# Patient Record
Sex: Female | Born: 1980 | Race: White | Hispanic: No | Marital: Married | Smoking: Never smoker
Health system: Southern US, Community
[De-identification: ages and names within clinical notes are randomized; demographics above are authoritative.]

## PROBLEM LIST (undated history)

## (undated) DIAGNOSIS — E039 Hypothyroidism, unspecified: Secondary | ICD-10-CM

## (undated) DIAGNOSIS — E282 Polycystic ovarian syndrome: Secondary | ICD-10-CM

## (undated) DIAGNOSIS — Z8619 Personal history of other infectious and parasitic diseases: Secondary | ICD-10-CM

## (undated) HISTORY — PX: WISDOM TOOTH EXTRACTION: SHX21

---

## 2006-08-30 ENCOUNTER — Ambulatory Visit: Payer: Self-pay | Admitting: Obstetrics and Gynecology

## 2006-08-30 ENCOUNTER — Encounter: Payer: Self-pay | Admitting: Obstetrics and Gynecology

## 2009-09-19 ENCOUNTER — Inpatient Hospital Stay (HOSPITAL_COMMUNITY): Admission: AD | Admit: 2009-09-19 | Discharge: 2009-09-19 | Payer: Self-pay | Admitting: Obstetrics & Gynecology

## 2009-09-19 ENCOUNTER — Ambulatory Visit: Payer: Self-pay | Admitting: Obstetrics & Gynecology

## 2009-09-21 ENCOUNTER — Ambulatory Visit: Payer: Self-pay | Admitting: Obstetrics and Gynecology

## 2009-09-21 ENCOUNTER — Inpatient Hospital Stay (HOSPITAL_COMMUNITY): Admission: AD | Admit: 2009-09-21 | Discharge: 2009-09-21 | Payer: Self-pay | Admitting: Obstetrics & Gynecology

## 2009-09-28 ENCOUNTER — Ambulatory Visit: Payer: Self-pay | Admitting: Obstetrics & Gynecology

## 2009-09-28 ENCOUNTER — Ambulatory Visit (HOSPITAL_COMMUNITY): Admission: RE | Admit: 2009-09-28 | Discharge: 2009-09-28 | Payer: Self-pay | Admitting: Obstetrics & Gynecology

## 2009-10-10 DEATH — deceased

## 2009-12-14 LAB — GC/CHLAMYDIA PROBE AMP, GENITAL
Chlamydia: NEGATIVE
Gonorrhea: NEGATIVE

## 2009-12-14 LAB — RPR: RPR: NONREACTIVE

## 2009-12-14 LAB — TYPE AND SCREEN: Antibody Screen: NEGATIVE

## 2010-03-25 LAB — CBC
Hemoglobin: 13.8 g/dL (ref 12.0–15.0)
Platelets: 252 10*3/uL (ref 150–400)
RBC: 4.53 MIL/uL (ref 3.87–5.11)
WBC: 10.8 10*3/uL — ABNORMAL HIGH (ref 4.0–10.5)

## 2010-03-25 LAB — GC/CHLAMYDIA PROBE AMP, GENITAL: GC Probe Amp, Genital: NEGATIVE

## 2010-03-25 LAB — HCG, QUANTITATIVE, PREGNANCY
hCG, Beta Chain, Quant, S: 1096 m[IU]/mL — ABNORMAL HIGH (ref <5)
hCG, Beta Chain, Quant, S: 157 m[IU]/mL — ABNORMAL HIGH (ref <5)

## 2010-03-25 LAB — POCT PREGNANCY, URINE: Preg Test, Ur: POSITIVE

## 2010-03-25 LAB — WET PREP, GENITAL
Clue Cells Wet Prep HPF POC: NONE SEEN
Trich, Wet Prep: NONE SEEN

## 2010-04-11 ENCOUNTER — Inpatient Hospital Stay (HOSPITAL_COMMUNITY): Admission: AD | Admit: 2010-04-11 | Payer: Self-pay | Source: Ambulatory Visit | Admitting: Obstetrics and Gynecology

## 2010-05-25 NOTE — Group Therapy Note (Signed)
NAMEMAHATI, VAJDA NO.:  [id number]   MEDICAL RECORD NO.:  [id number]          PATIENT TYPE:  WOC   LOCATION:  [facility]                   FACILITY:  [facility]   PHYSICIAN:  Argentina Donovan, MD        DATE OF BIRTH:  03-13-1980   DATE OF SERVICE:  08/30/2006                                  CLINIC NOTE   HISTORY OF PRESENT ILLNESS:  The patient is a 30 year old virginal  nulligravida, Caucasian female who is in for a premarital exam and  starting on birth control.  In addition to this, she is on several drugs  for treatment of acne and has a family history of hypothyroidism.  Other  than that, she is in very good health and has no other problems.  She is  5 feet, 6 inches, weighs 130 pounds.  Normal blood pressure 108/76.  We  have discussed the birth control pills with her and thought that the  generic Ovcon, because of the higher estrogen, might be beneficial to  her acne as well as her crampy dysmenorrhea that she has had since  menarche.   PHYSICAL EXAMINATION:  BREASTS:  Symmetrical with no dominant masses.  No nipple discharge.  No tenderness.  ABDOMEN:  Soft, nontender.  No masses or organomegaly.  External  Genitalia normal.  BUS within normal limits.  Vagina is clean and well  rugated.  The cervix is clean.  Nulliparous.  Uterus is anterior normal  size, consistency and the adnexa is normal.   PLAN:  TSH will be drawn.  The patient will placed on generic Ovcon.   DIAGNOSIS:  1. Premarital exam.  2. Dysmenorrhea.  3. Acne vulgaris.  4. Family history of hypothyroidism.           ______________________________  Argentina Donovan, MD     PR/MEDQ  D:  08/30/2006  T:  08/31/2006  Job:  [id number]

## 2010-08-08 ENCOUNTER — Encounter (HOSPITAL_COMMUNITY): Payer: Self-pay | Admitting: *Deleted

## 2010-08-08 ENCOUNTER — Inpatient Hospital Stay (HOSPITAL_COMMUNITY): Payer: BC Managed Care – PPO | Admitting: Anesthesiology

## 2010-08-08 ENCOUNTER — Other Ambulatory Visit: Payer: Self-pay | Admitting: Obstetrics and Gynecology

## 2010-08-08 ENCOUNTER — Encounter (HOSPITAL_COMMUNITY): Payer: Self-pay | Admitting: Anesthesiology

## 2010-08-08 ENCOUNTER — Encounter (HOSPITAL_COMMUNITY)
Admission: AD | Disposition: A | Discharge status: Home / Self Care | Payer: Self-pay | Source: Ambulatory Visit | Attending: Obstetrics and Gynecology

## 2010-08-08 ENCOUNTER — Inpatient Hospital Stay (HOSPITAL_COMMUNITY)
Admission: AD | Admit: 2010-08-08 | Discharge: 2010-08-11 | DRG: 371 | Disposition: A | Discharge status: Home / Self Care | Payer: BC Managed Care – PPO | Source: Ambulatory Visit | Attending: Obstetrics and Gynecology | Admitting: Obstetrics and Gynecology

## 2010-08-08 DIAGNOSIS — O324XX0 Maternal care for high head at term, not applicable or unspecified: Secondary | ICD-10-CM | POA: Diagnosis present

## 2010-08-08 DIAGNOSIS — E079 Disorder of thyroid, unspecified: Secondary | ICD-10-CM | POA: Diagnosis present

## 2010-08-08 DIAGNOSIS — E039 Hypothyroidism, unspecified: Secondary | ICD-10-CM | POA: Diagnosis present

## 2010-08-08 HISTORY — DX: Hypothyroidism, unspecified: E03.9

## 2010-08-08 LAB — CBC
HCT: 39.8 % (ref 36.0–46.0)
MCHC: 33.2 g/dL (ref 30.0–36.0)
RDW: 14.5 % (ref 11.5–15.5)

## 2010-08-08 LAB — STREP B DNA PROBE: GBS: NEGATIVE

## 2010-08-08 SURGERY — Surgical Case
Anesthesia: Epidural | Site: Abdomen | Wound class: Clean Contaminated

## 2010-08-08 MED ORDER — SODIUM CHLORIDE 0.9 % IR SOLN
Status: DC | PRN
  Administered 2010-08-08: 1000 mL

## 2010-08-08 MED ORDER — CITRIC ACID-SODIUM CITRATE 334-500 MG/5ML PO SOLN
30.0000 mL | ORAL | Status: DC | PRN
  Administered 2010-08-08: 30 mL via ORAL
  Filled 2010-08-08: qty 15

## 2010-08-08 MED ORDER — IBUPROFEN 600 MG PO TABS: 600.0000 mg | ORAL_TABLET | Freq: Four times a day (QID) | ORAL | Status: DC | PRN

## 2010-08-08 MED ORDER — OXYTOCIN 20 UNITS IN LACTATED RINGERS INFUSION - SIMPLE
125.0000 mL/h | INTRAVENOUS | Status: AC
  Administered 2010-08-09: 125 mL/h via INTRAVENOUS

## 2010-08-08 MED ORDER — MEPERIDINE HCL 25 MG/ML IJ SOLN
INTRAMUSCULAR | Status: AC
  Filled 2010-08-08: qty 1

## 2010-08-08 MED ORDER — TERBUTALINE SULFATE 1 MG/ML IJ SOLN: 0.2500 mg | Freq: Once | INTRAMUSCULAR | Status: AC | PRN

## 2010-08-08 MED ORDER — NALBUPHINE HCL 10 MG/ML IJ SOLN
5.0000 mg | INTRAMUSCULAR | Status: DC | PRN
Start: 2010-08-08 — End: 2010-08-11
  Filled 2010-08-08: qty 1

## 2010-08-08 MED ORDER — HYDROMORPHONE HCL 1 MG/ML IJ SOLN
INTRAMUSCULAR | Status: AC
  Filled 2010-08-08: qty 1

## 2010-08-08 MED ORDER — SODIUM CHLORIDE 0.9 % IV SOLN
1.0000 ug/kg/h | INTRAVENOUS | Status: DC | PRN
  Filled 2010-08-08: qty 2.5

## 2010-08-08 MED ORDER — LACTATED RINGERS IV SOLN
500.0000 mL | INTRAVENOUS | Status: DC | PRN
  Administered 2010-08-08 (×2): 500 mL via INTRAVENOUS

## 2010-08-08 MED ORDER — LACTATED RINGERS IV SOLN: 500.0000 mL | Freq: Once | INTRAVENOUS | Status: DC

## 2010-08-08 MED ORDER — FLEET ENEMA 7-19 GM/118ML RE ENEM: 1.0000 | ENEMA | RECTAL | Status: DC | PRN

## 2010-08-08 MED ORDER — NALOXONE HCL 0.4 MG/ML IJ SOLN: 0.4000 mg | INTRAMUSCULAR | Status: DC | PRN

## 2010-08-08 MED ORDER — MEPERIDINE HCL 25 MG/ML IJ SOLN
INTRAMUSCULAR | Status: DC | PRN
  Administered 2010-08-08: 25 mg via INTRAVENOUS

## 2010-08-08 MED ORDER — CEFAZOLIN SODIUM 1-5 GM-% IV SOLN
INTRAVENOUS | Status: DC | PRN
  Administered 2010-08-08: 1 g via INTRAVENOUS

## 2010-08-08 MED ORDER — PHENYLEPHRINE 40 MCG/ML (10ML) SYRINGE FOR IV PUSH (FOR BLOOD PRESSURE SUPPORT)
PREFILLED_SYRINGE | INTRAVENOUS | Status: AC
  Filled 2010-08-08: qty 10

## 2010-08-08 MED ORDER — MEDROXYPROGESTERONE ACETATE 150 MG/ML IM SUSP: 150.0000 mg | INTRAMUSCULAR | Status: DC | PRN

## 2010-08-08 MED ORDER — ONDANSETRON HCL 4 MG PO TABS: 4.0000 mg | ORAL_TABLET | ORAL | Status: DC | PRN

## 2010-08-08 MED ORDER — WITCH HAZEL-GLYCERIN EX PADS: MEDICATED_PAD | CUTANEOUS | Status: DC | PRN

## 2010-08-08 MED ORDER — ONDANSETRON HCL 4 MG/2ML IJ SOLN: 4.0000 mg | INTRAMUSCULAR | Status: DC | PRN

## 2010-08-08 MED ORDER — PHENYLEPHRINE 40 MCG/ML (10ML) SYRINGE FOR IV PUSH (FOR BLOOD PRESSURE SUPPORT)
80.0000 ug | PREFILLED_SYRINGE | INTRAVENOUS | Status: DC | PRN
  Filled 2010-08-08 (×2): qty 5

## 2010-08-08 MED ORDER — PHENYLEPHRINE HCL 10 MG/ML IJ SOLN
INTRAMUSCULAR | Status: DC | PRN
  Administered 2010-08-08: 40 ug via INTRAVENOUS
  Administered 2010-08-08: 80 ug via INTRAVENOUS
  Administered 2010-08-08: 120 ug via INTRAVENOUS

## 2010-08-08 MED ORDER — FENTANYL 2.5 MCG/ML BUPIVACAINE 1/10 % EPIDURAL INFUSION (WH - ANES)
14.0000 mL/h | INTRAMUSCULAR | Status: DC
  Administered 2010-08-08 (×2): 14 mL/h via EPIDURAL
  Filled 2010-08-08 (×2): qty 60

## 2010-08-08 MED ORDER — NALBUPHINE SYRINGE 5 MG/0.5 ML
5.0000 mg | INJECTION | INTRAMUSCULAR | Status: DC | PRN
  Filled 2010-08-08: qty 0.5

## 2010-08-08 MED ORDER — MEASLES, MUMPS & RUBELLA VAC ~~LOC~~ INJ
0.5000 mL | INJECTION | Freq: Once | SUBCUTANEOUS | Status: DC
  Filled 2010-08-08: qty 0.5

## 2010-08-08 MED ORDER — SODIUM CHLORIDE 0.9 % IJ SOLN: 3.0000 mL | Freq: Two times a day (BID) | INTRAMUSCULAR | Status: DC

## 2010-08-08 MED ORDER — KETOROLAC TROMETHAMINE 30 MG/ML IJ SOLN
30.0000 mg | Freq: Four times a day (QID) | INTRAMUSCULAR | Status: AC | PRN
  Administered 2010-08-09: 30 mg via INTRAVENOUS
  Filled 2010-08-08: qty 1

## 2010-08-08 MED ORDER — ONDANSETRON HCL 4 MG/2ML IJ SOLN
INTRAMUSCULAR | Status: AC
  Filled 2010-08-08: qty 2

## 2010-08-08 MED ORDER — OXYCODONE-ACETAMINOPHEN 5-325 MG PO TABS: 1.0000 | ORAL_TABLET | ORAL | Status: DC | PRN

## 2010-08-08 MED ORDER — SIMETHICONE 80 MG PO CHEW
80.0000 mg | CHEWABLE_TABLET | ORAL | Status: DC | PRN
  Administered 2010-08-09: 80 mg via ORAL

## 2010-08-08 MED ORDER — HYDROMORPHONE HCL 1 MG/ML IJ SOLN
INTRAMUSCULAR | Status: DC | PRN
  Administered 2010-08-08: 1 mg via INTRAVENOUS

## 2010-08-08 MED ORDER — ONDANSETRON HCL 4 MG/2ML IJ SOLN: 4.0000 mg | Freq: Three times a day (TID) | INTRAMUSCULAR | Status: DC | PRN

## 2010-08-08 MED ORDER — KETOROLAC TROMETHAMINE 60 MG/2ML IM SOLN
60.0000 mg | Freq: Once | INTRAMUSCULAR | Status: AC | PRN
  Administered 2010-08-08: 60 mg via INTRAMUSCULAR

## 2010-08-08 MED ORDER — SODIUM BICARBONATE 8.4 % IV SOLN
INTRAVENOUS | Status: AC
  Filled 2010-08-08: qty 50

## 2010-08-08 MED ORDER — MENTHOL 3 MG MT LOZG: 1.0000 | LOZENGE | OROMUCOSAL | Status: DC | PRN

## 2010-08-08 MED ORDER — SENNOSIDES-DOCUSATE SODIUM 8.6-50 MG PO TABS
1.0000 | ORAL_TABLET | Freq: Every day | ORAL | Status: DC
  Administered 2010-08-10: 2 via ORAL

## 2010-08-08 MED ORDER — OXYTOCIN 20 UNITS IN LACTATED RINGERS INFUSION - SIMPLE
125.0000 mL/h | Freq: Once | INTRAVENOUS | Status: DC
  Filled 2010-08-08 (×2): qty 1000

## 2010-08-08 MED ORDER — LACTATED RINGERS IV SOLN
INTRAVENOUS | Status: DC
  Administered 2010-08-08 (×3): via INTRAVENOUS

## 2010-08-08 MED ORDER — OXYTOCIN 20 UNITS IN LACTATED RINGERS INFUSION - SIMPLE
INTRAVENOUS | Status: DC | PRN
  Administered 2010-08-08: 20 [IU] via INTRAVENOUS

## 2010-08-08 MED ORDER — DIPHENHYDRAMINE HCL 50 MG/ML IJ SOLN: 12.5000 mg | INTRAMUSCULAR | Status: DC | PRN

## 2010-08-08 MED ORDER — LIDOCAINE-EPINEPHRINE (PF) 2 %-1:200000 IJ SOLN
INTRAMUSCULAR | Status: AC
  Filled 2010-08-08: qty 20

## 2010-08-08 MED ORDER — OXYCODONE-ACETAMINOPHEN 5-325 MG PO TABS: 2.0000 | ORAL_TABLET | ORAL | Status: DC | PRN

## 2010-08-08 MED ORDER — ONDANSETRON HCL 4 MG/2ML IJ SOLN: 4.0000 mg | Freq: Four times a day (QID) | INTRAMUSCULAR | Status: DC | PRN

## 2010-08-08 MED ORDER — EPHEDRINE 5 MG/ML INJ
10.0000 mg | INTRAVENOUS | Status: DC | PRN
  Filled 2010-08-08 (×2): qty 4

## 2010-08-08 MED ORDER — IBUPROFEN 600 MG PO TABS
600.0000 mg | ORAL_TABLET | Freq: Four times a day (QID) | ORAL | Status: DC
  Administered 2010-08-09 – 2010-08-11 (×9): 600 mg via ORAL
  Filled 2010-08-08 (×4): qty 1

## 2010-08-08 MED ORDER — KETOROLAC TROMETHAMINE 60 MG/2ML IM SOLN
INTRAMUSCULAR | Status: AC
  Administered 2010-08-08: 60 mg via INTRAMUSCULAR
  Filled 2010-08-08: qty 2

## 2010-08-08 MED ORDER — PHENYLEPHRINE 40 MCG/ML (10ML) SYRINGE FOR IV PUSH (FOR BLOOD PRESSURE SUPPORT)
80.0000 ug | PREFILLED_SYRINGE | INTRAVENOUS | Status: DC | PRN
  Filled 2010-08-08: qty 5

## 2010-08-08 MED ORDER — MORPHINE SULFATE (PF) 0.5 MG/ML IJ SOLN
INTRAMUSCULAR | Status: DC | PRN
  Administered 2010-08-08: 1 mg via INTRAVENOUS
  Administered 2010-08-08: 4 mg via EPIDURAL

## 2010-08-08 MED ORDER — DIPHENHYDRAMINE HCL 50 MG/ML IJ SOLN: 25.0000 mg | INTRAMUSCULAR | Status: DC | PRN

## 2010-08-08 MED ORDER — METOCLOPRAMIDE HCL 5 MG/ML IJ SOLN: 10.0000 mg | Freq: Three times a day (TID) | INTRAMUSCULAR | Status: DC | PRN

## 2010-08-08 MED ORDER — DEXTROSE IN LACTATED RINGERS 5 % IV SOLN
INTRAVENOUS | Status: DC
  Administered 2010-08-09: 08:00:00 via INTRAVENOUS

## 2010-08-08 MED ORDER — DIPHENHYDRAMINE HCL 25 MG PO CAPS: 25.0000 mg | ORAL_CAPSULE | ORAL | Status: DC | PRN

## 2010-08-08 MED ORDER — SCOPOLAMINE 1 MG/3DAYS TD PT72
MEDICATED_PATCH | TRANSDERMAL | Status: AC
  Administered 2010-08-08: 1.5 mg via TRANSDERMAL
  Filled 2010-08-08: qty 1

## 2010-08-08 MED ORDER — LIDOCAINE HCL (PF) 1 % IJ SOLN
30.0000 mL | INTRAMUSCULAR | Status: DC | PRN
  Filled 2010-08-08: qty 30

## 2010-08-08 MED ORDER — LIDOCAINE-EPINEPHRINE (PF) 2 %-1:200000 IJ SOLN
INTRAMUSCULAR | Status: DC | PRN
  Administered 2010-08-08: 20 mL via EPIDURAL

## 2010-08-08 MED ORDER — EPHEDRINE 5 MG/ML INJ
10.0000 mg | INTRAVENOUS | Status: DC | PRN
  Filled 2010-08-08: qty 4

## 2010-08-08 MED ORDER — SCOPOLAMINE 1 MG/3DAYS TD PT72
1.0000 | MEDICATED_PATCH | Freq: Once | TRANSDERMAL | Status: DC
  Administered 2010-08-08: 1.5 mg via TRANSDERMAL

## 2010-08-08 MED ORDER — DIPHENHYDRAMINE HCL 25 MG PO CAPS: 25.0000 mg | ORAL_CAPSULE | Freq: Four times a day (QID) | ORAL | Status: DC | PRN

## 2010-08-08 MED ORDER — MORPHINE SULFATE 0.5 MG/ML IJ SOLN
INTRAMUSCULAR | Status: AC
  Filled 2010-08-08: qty 10

## 2010-08-08 MED ORDER — MEPERIDINE HCL 25 MG/ML IJ SOLN: 6.2500 mg | INTRAMUSCULAR | Status: DC | PRN

## 2010-08-08 MED ORDER — ONDANSETRON HCL 4 MG/2ML IJ SOLN
INTRAMUSCULAR | Status: DC | PRN
  Administered 2010-08-08: 4 mg via INTRAVENOUS

## 2010-08-08 MED ORDER — PRENATAL PLUS 27-1 MG PO TABS
1.0000 | ORAL_TABLET | Freq: Every day | ORAL | Status: DC
  Administered 2010-08-09 – 2010-08-11 (×3): 1 via ORAL
  Filled 2010-08-08 (×3): qty 1

## 2010-08-08 MED ORDER — FENTANYL CITRATE 0.05 MG/ML IJ SOLN
INTRAMUSCULAR | Status: DC | PRN
  Administered 2010-08-08: 100 ug via INTRAVENOUS

## 2010-08-08 MED ORDER — TETANUS-DIPHTH-ACELL PERTUSSIS 5-2.5-18.5 LF-MCG/0.5 IM SUSP: 0.5000 mL | Freq: Once | INTRAMUSCULAR | Status: DC

## 2010-08-08 MED ORDER — ACETAMINOPHEN 325 MG PO TABS: 650.0000 mg | ORAL_TABLET | ORAL | Status: DC | PRN

## 2010-08-08 MED ORDER — KETOROLAC TROMETHAMINE 30 MG/ML IJ SOLN: 30.0000 mg | Freq: Four times a day (QID) | INTRAMUSCULAR | Status: AC | PRN

## 2010-08-08 MED ORDER — NALBUPHINE HCL 10 MG/ML IJ SOLN
5.0000 mg | INTRAMUSCULAR | Status: DC | PRN
  Filled 2010-08-08: qty 1

## 2010-08-08 MED ORDER — OXYTOCIN 10 UNIT/ML IJ SOLN
INTRAMUSCULAR | Status: AC
  Filled 2010-08-08: qty 4

## 2010-08-08 MED ORDER — CEFAZOLIN SODIUM 1-5 GM-% IV SOLN
1.0000 g | Freq: Once | INTRAVENOUS | Status: DC
  Filled 2010-08-08: qty 50

## 2010-08-08 MED ORDER — FENTANYL CITRATE 0.05 MG/ML IJ SOLN
INTRAMUSCULAR | Status: AC
  Filled 2010-08-08: qty 2

## 2010-08-08 MED ORDER — OXYTOCIN 20 UNITS IN LACTATED RINGERS INFUSION - SIMPLE
1.0000 m[IU]/min | INTRAVENOUS | Status: DC
  Administered 2010-08-08: 12 m[IU]/min via INTRAVENOUS
  Administered 2010-08-08: 2 m[IU]/min via INTRAVENOUS

## 2010-08-08 MED ORDER — SODIUM CHLORIDE 0.9 % IJ SOLN: 3.0000 mL | INTRAMUSCULAR | Status: DC | PRN

## 2010-08-08 MED ORDER — SIMETHICONE 80 MG PO CHEW
80.0000 mg | CHEWABLE_TABLET | Freq: Three times a day (TID) | ORAL | Status: DC
  Administered 2010-08-09 – 2010-08-11 (×11): 80 mg via ORAL

## 2010-08-08 MED ORDER — IBUPROFEN 600 MG PO TABS
600.0000 mg | ORAL_TABLET | Freq: Four times a day (QID) | ORAL | Status: DC | PRN
  Filled 2010-08-08 (×5): qty 1

## 2010-08-08 SURGICAL SUPPLY — 25 items
CHLORAPREP W/TINT 26ML (MISCELLANEOUS) ×2 IMPLANT
CLOTH BEACON ORANGE TIMEOUT ST (SAFETY) ×2 IMPLANT
DRAPE UTILITY XL STRL (DRAPES) ×4 IMPLANT
ELECT REM PT RETURN 9FT ADLT (ELECTROSURGICAL) ×2
ELECTRODE REM PT RTRN 9FT ADLT (ELECTROSURGICAL) ×1 IMPLANT
EXTRACTOR VACUUM M CUP 4 TUBE (SUCTIONS) IMPLANT
GLOVE BIO SURGEON STRL SZ 6.5 (GLOVE) ×2 IMPLANT
GLOVE BIOGEL PI IND STRL 7.0 (GLOVE) ×2 IMPLANT
GLOVE BIOGEL PI INDICATOR 7.0 (GLOVE) ×2
GOWN PREVENTION PLUS LG XLONG (DISPOSABLE) ×6 IMPLANT
KIT ABG SYR 3ML LUER SLIP (SYRINGE) ×2 IMPLANT
NEEDLE HYPO 25X5/8 SAFETYGLIDE (NEEDLE) ×2 IMPLANT
NS IRRIG 1000ML POUR BTL (IV SOLUTION) ×2 IMPLANT
PACK C SECTION WH (CUSTOM PROCEDURE TRAY) ×2 IMPLANT
SLEEVE SCD COMPRESS KNEE MED (MISCELLANEOUS) ×2 IMPLANT
STAPLER VISISTAT 35W (STAPLE) ×2 IMPLANT
SUT CHROMIC 0 CT 802H (SUTURE) IMPLANT
SUT CHROMIC 0 CTX 36 (SUTURE) ×4 IMPLANT
SUT MNCRL AB 3-0 PS2 27 (SUTURE) IMPLANT
SUT MON AB-0 CT1 36 (SUTURE) ×2 IMPLANT
SUT PDS AB 0 CTX 60 (SUTURE) ×2 IMPLANT
SUT PLAIN 0 NONE (SUTURE) IMPLANT
TOWEL OR 17X24 6PK STRL BLUE (TOWEL DISPOSABLE) ×4 IMPLANT
TRAY FOLEY CATH 14FR (SET/KITS/TRAYS/PACK) ×2 IMPLANT
WATER STERILE IRR 1000ML POUR (IV SOLUTION) ×2 IMPLANT

## 2010-08-08 NOTE — Progress Notes (Signed)
Pt uncomfortable w/ c/o back pain and ctx  FHT:  Reassuring toco Q2-3 Cvx 5/90/-2, no change  A/P:  Plan for epidural.  Recheck cvx 1hr

## 2010-08-08 NOTE — Progress Notes (Signed)
C/o pain w/ contractions. Declines meds or epidural  FHT reassuring, good fm Toco Q5-7min Cvx 5/90/-2, no change in 2hrs IUPC placed easily  A/p:  Exp mngt.

## 2010-08-08 NOTE — Progress Notes (Signed)
I've been contracting over 24hrs. Some bloody show. Some sharp pains in lower abd, esp when sitting up straight or sometimes with FM.

## 2010-08-08 NOTE — Progress Notes (Signed)
Dr Renaldo Fiddler notified of pt's admission and status. Aware of ctx pattern, sve, reactive FHR. Will admit to BS. Report called to [employer].

## 2010-08-08 NOTE — Anesthesia Preprocedure Evaluation (Addendum)
Anesthesia Evaluation  Name, MR# and DOB Patient awake  General Assessment Comment  Reviewed: Allergy & Precautions, H&P  and Patient's Chart, lab work & pertinent test results  Airway Mallampati: III TM Distance: >3 FB Neck ROM: full    Dental No notable dental hx    Pulmonaryneg pulmonary ROS    clear to auscultation  pulmonary exam normal   Cardiovascular regular Normal   Neuro/PsychNegative Neurological ROS Negative Psych ROS  GI/Hepatic/Renal negative GI ROS, negative Liver ROS, and negative Renal ROS (+)       Endo/Other  Negative Endocrine ROS (+)  Hypothyroidism,  Abdominal   Musculoskeletal  Hematology negative hematology ROS (+)   Peds  Reproductive/Obstetrics (+) Pregnancy   Anesthesia Other Findings             Anesthesia Physical Anesthesia Plan  ASA: III  Anesthesia Plan: Epidural   Post-op Pain Management:    Induction:   Airway Management Planned:   Additional Equipment:   Intra-op Plan:   Post-operative Plan:   Informed Consent: I have reviewed the patients History and Physical, chart, labs and discussed the procedure including the risks, benefits and alternatives for the proposed anesthesia with the patient or authorized representative who has indicated his/her understanding and acceptance.     Plan Discussed with:   Anesthesia Plan Comments:         Anesthesia Quick Evaluation

## 2010-08-08 NOTE — Progress Notes (Signed)
Pt feeling increased rectal pressure.  FHT: reassuring Toco: Q1-2 Cvx:  AL/0  A/P:  Exp mngt.

## 2010-08-08 NOTE — H&P (Signed)
Katherine Ochoa is a 30 y.o. female @ 34 weels presenting for labor.  No vb or lof.  Pregnancy uncomplicated.   History OB History    Grav Para Term Preterm Abortions TAB SAB Ect Mult Living   2 1 1  1  1    0     Past Medical History  Diagnosis Date  . Hypothyroidism    Past Surgical History  Procedure Date  . Wisdom tooth extraction    Family History: family history includes Diabetes in her father; Heart disease in her father; and Stroke in her father. Social History:  reports that she has never smoked. She has never used smokeless tobacco. She reports that she does not drink alcohol or use illicit drugs.  ROS neg  Dilation: 5 Effacement (%): 90 Station: -2 Exam by:: Quintella Baton RNC Blood pressure 118/64, pulse 72, temperature 98.1 F (36.7 C), temperature source Oral, resp. rate 20, height 5\' 5"  (1.651 m), weight 105.688 kg (233 lb), unknown if currently breastfeeding. Exam Physical Exam Gen: uncomfortable w/ contractions Abd - gravid, NT Ext NT Cvx 5cm per RN exam  Prenatal labs: ABO, Rh: O POS (09/10 0355) Antibody: Negative (12/05 0000) Rubella:   RPR: Nonreactive (12/05 0000)  HBsAg: Negative (12/05 0000)  HIV: Non-reactive (12/05 0000)  GBS: Negative (07/29 0000)   Assessment/Plan: Admit Exp mngt Pt requests intermittent monitoring   Jahdai Padovano 08/08/2010, 8:02 AM

## 2010-08-08 NOTE — Transfer of Care (Signed)
Immediate Anesthesia Transfer of Care Note  Patient: Katherine Ochoa  Procedure(s) Performed:  CESAREAN SECTION - primary of baby boy at 2105  Patient Location: PACU  Anesthesia Type: Epidural  Level of Consciousness: awake, alert  and oriented  Airway & Oxygen Therapy: Patient Spontanous Breathing  Post-op Assessment: Report given to PACU RN, Post -op Vital signs reviewed and stable and Patient moving all extremities  Post vital signs: stable  Complications: No apparent anesthesia complications

## 2010-08-08 NOTE — Progress Notes (Signed)
Pt getting more comfortable w/ epidural.  C/o pain with foley catheter  FHT reassuring Toco Q2-3 Cvx 5-6/C/-2  A/P:  No cervical change.  Plan to recheck in one hour since epidual delayed.   Pt and husband agree with plan

## 2010-08-08 NOTE — Progress Notes (Signed)
Pt c/o increased pain with foley catheter and IUPC.  Pt requests removal of both.  C/o pressure w/ contractions  FHT:  Reassuring w/ accels Toco:  Q1-2 Cvx: C/C/0  Will start pushing.   Foley cath and IUPC removed

## 2010-08-08 NOTE — Op Note (Signed)
Cesarean Section Procedure Note  Indications: failure to progress: arrest of descent  Pre-operative Diagnosis: 41 week 1 day pregnancy.  Post-operative Diagnosis: same  Surgeon: Airika Alkhatib   Assistants: none  Anesthesia: Epidural anesthesia  Procedure Details  The risks, benefits, complications, treatment options, and expected outcomes were discussed with the patient.  The patient concurred with the proposed plan, giving informed consent.  The patient was taken to Operating Room # 1, identified as Katherine Ochoa and the procedure verified as C-Section Delivery. A Time Out was held and the above information confirmed.  After induction of anesthesia, the patient was draped and prepped in the usual sterile manner. A Pfannenstiel incision was made and carried down through the subcutaneous tissue to the fascia. Fascial incision was made and extended transversely. The fascia was separated from the underlying rectus tissue superiorly and inferiorly. The peritoneum was identified and entered. Peritoneal incision was extended longitudinally. The utero-vesical peritoneal reflection was incised transversely and the bladder flap was bluntly freed from the lower uterine segment. A low transverse uterine incision was made. Delivered from OP vertex presentation was a viable female infant with Apgar scores of 8 at one minute and 9 at five minutes. After the umbilical cord was clamped and cut cord blood was obtained for evaluation. The placenta was removed intact and appeared normal. The uterine outline, tubes and ovaries appeared normal. The uterine incision was closed with running locked sutures of 0 chromic. Hemostasis was observed. Lavage was carried out until clear. Peritoneum closed with 0 monocryl.  The fascia was then reapproximated with running sutures of 0 PDS. The skin was reapproximated with staples.  Instrument, sponge, and needle counts were correct prior the abdominal closure and at the conclusion  of the case.   Findings: Female infant 8#8oz, normal appearing pelvic anatomy.  Cord pH 7.27  Estimated Blood Loss:  700cc         Total IV Fluids:         Specimens: placenta to path         UOP:  100cc, blood tinged but clear in tube         Complications:  None; patient tolerated the procedure well.         Disposition: PACU - hemodynamically stable.         Condition: stable  Attending Attestation: I was present and scrubbed for the entire procedure.

## 2010-08-08 NOTE — Anesthesia Procedure Notes (Signed)
Epidural Patient location during procedure: OB Start time: 08/08/2010 2:57 PM  Staffing Anesthesiologist: Brayton Caves R Performed by: anesthesiologist   Preanesthetic Checklist Completed: patient identified, site marked, surgical consent, pre-op evaluation, timeout performed, IV checked, risks and benefits discussed and monitors and equipment checked  Epidural Patient position: sitting Prep: site prepped and draped and DuraPrep Patient monitoring: continuous pulse ox and blood pressure Approach: midline Injection technique: LOR saline  Needle:  Needle type: Tuohy  Needle gauge: 17 G Needle length: 9 cm Needle insertion depth: 5 cm cm Catheter type: closed end flexible Catheter size: 19 Gauge Catheter at skin depth: 10 cm Test dose: negative  Assessment Events: blood not aspirated, injection not painful, no injection resistance, negative IV test and no paresthesia  Additional Notes Patient identified.  Risk benefits discussed including failed block, incomplete pain control, headache, nerve damage, paralysis, blood pressure changes, nausea, vomiting, reactions to medication both toxic or allergic, and postpartum back pain.  Patient expressed understanding and wished to proceed.  All questions were answered.  Sterile technique used throughout procedure and epidural site dressed with sterile barrier dressing. No paresthesia or other complications noted. 5 minutes prior to starting epidural infusion of fentanyl and bupivicaine, the epidural was loaded with 9 ml of 1.5% lidocaine in three separate doses.  The patient did not experience any signs of intravascular injection such as tinnitus or metallic taste in mouth nor signs of intrathecal spread such as rapid motor block. Please see nursing notes for vital signs.

## 2010-08-08 NOTE — Progress Notes (Signed)
Pt pushing  FHT reassuring w/ small variables during pushes Toco Q2 Cvx C/C/0  A/P:  Rec primary c-section.  Pt and husband agree to proceed

## 2010-08-08 NOTE — Progress Notes (Signed)
0530 To BS via w/c.

## 2010-08-08 NOTE — Plan of Care (Signed)
Problem: Consults Goal: Birthing Suites Patient Information Press F2 to bring up selections list Outcome: Completed/Met Date Met:  08/08/10  Pt > [redacted] weeks EGA

## 2010-08-08 NOTE — Progress Notes (Signed)
Pt pushing  AF, VSS FHT - reassuring Toco - Q2 Cvx - C/C/0  Will continue pushing, recheck in 20-30min.  If no descent, plan for c-section

## 2010-08-08 NOTE — Progress Notes (Signed)
Pt uncomfortable with contractions, declines pain mngt  FHT reassuring Toco Q3-5, IUPC fell out w/ ambulation Cvx 5/90/-2 IUPC easily replaced  A/P:  Offered pt primary c-section vs pitocin augmentation.  Pt elects for pitocin. Will recheck in 2 hours

## 2010-08-08 NOTE — Anesthesia Postprocedure Evaluation (Signed)
  Anesthesia Post-op Note  Patient: Katherine Ochoa  Procedure(s) Performed:  CESAREAN SECTION - primary of baby boy at 2105  No anesthesia complications.  Level of consciousness: alert. Cardiopulmonary status stable.  No follow-up care or observation required.  Cam Harnden L. Rodman Pickle, MD

## 2010-08-08 NOTE — Progress Notes (Signed)
Pt with rectal pressure and pain.  FHT:  Reassuring with occasional small variables Toco:  Q2-3 Cvx 7/C/-2  A/p:  Some change with dilation but still high in pelvis.  Will continue with exp mngt

## 2010-08-09 ENCOUNTER — Inpatient Hospital Stay (HOSPITAL_COMMUNITY): Admission: RE | Admit: 2010-08-09 | Payer: Self-pay | Source: Ambulatory Visit

## 2010-08-09 ENCOUNTER — Encounter (HOSPITAL_COMMUNITY): Payer: Self-pay | Admitting: *Deleted

## 2010-08-09 LAB — CBC
HCT: 35.7 % — ABNORMAL LOW (ref 36.0–46.0)
MCV: 92.5 fL (ref 78.0–100.0)
Platelets: 156 10*3/uL (ref 150–400)
RBC: 3.86 MIL/uL — ABNORMAL LOW (ref 3.87–5.11)
WBC: 16.2 10*3/uL — ABNORMAL HIGH (ref 4.0–10.5)

## 2010-08-09 NOTE — Anesthesia Postprocedure Evaluation (Signed)
  Anesthesia Post-op Note  Patient: Katherine Ochoa  Procedure(s) Performed:  CESAREAN SECTION - primary of baby boy at 2105  Patient Location147  Anesthesia Type: Regional  Level of Consciousness: awake  Airway and Oxygen Therapy: Patient Spontanous Breathing    Post-op Assessment: Post-op Vital signs reviewed  Post-op Vital Signs: Reviewed  Complications: No apparent anesthesia complications

## 2010-08-09 NOTE — Addendum Note (Signed)
Addendum  created 08/09/10 1127 by Randa Spike   Modules edited:Charges VN, Notes Section

## 2010-08-09 NOTE — Progress Notes (Signed)
Baby rooting and latched well with assistance. Infant 12 hours old.

## 2010-08-09 NOTE — Addendum Note (Signed)
Addendum  created 08/09/10 1127 by Chole Driver D Lateasha Breuer   Modules edited:Charges VN, Notes Section    

## 2010-08-09 NOTE — Progress Notes (Signed)
Subjective: Postpartum Day 1: Cesarean Delivery Patient reports tolerating PO.    Objective: Vital signs in last 24 hours: Temp:  [97.7 F (36.5 C)-99.7 F (37.6 C)] 98.4 F (36.9 C) (07/30 0710) Pulse Rate:  [66-131] 81  (07/30 0710) Resp:  [18-24] 20  (07/30 0710) BP: (75-135)/(35-97) 92/56 mmHg (07/30 0710) SpO2:  [94 %-100 %] 95 % (07/30 0504) Weight:  [102.513 kg (226 lb)] 226 lb (102.513 kg) (07/30 0509)  Physical Exam:  General: alert and cooperative Lochia: appropriate Uterine Fundus: firm Abd dressing CDI Foley with adequate op DVT Evaluation: No evidence of DVT seen on physical exam.   Basename 08/09/10 0540 08/08/10 0600  HGB 11.6* 13.2  HCT 35.7* 39.8    Assessment/Plan: Status post Cesarean section. Doing well postoperatively.  Continue current care.  CURTIS,CAROL G 08/09/2010, 8:21 AM

## 2010-08-10 NOTE — Progress Notes (Signed)
Subjective: Postpartum Day 2: Cesarean Delivery Patient reports tolerating PO and + flatus.    Objective: Vital signs in last 24 hours: Temp:  [97.7 F (36.5 C)-98.4 F (36.9 C)] 97.8 F (36.6 C) (07/31 0623) Pulse Rate:  [67-98] 67  (07/31 0623) Resp:  [18-20] 18  (07/31 0623) BP: (96-117)/(62-71) 96/64 mmHg (07/31 0623) SpO2:  [95 %-98 %] 98 % (07/30 2100)  Physical Exam:  General: alert and cooperative Lochia: appropriate Uterine Fundus: firm Incision: healing well DVT Evaluation: No evidence of DVT seen on physical exam.   Basename 08/09/10 0540 08/08/10 0600  HGB 11.6* 13.2  HCT 35.7* 39.8    Assessment/Plan: Status post Cesarean section. Doing well postoperatively.  Continue current care.  CURTIS,CAROL G 08/10/2010, 8:48 AM

## 2010-08-11 MED ORDER — IBUPROFEN 600 MG PO TABS: 600.0000 mg | ORAL_TABLET | Freq: Four times a day (QID) | ORAL | Status: AC | PRN

## 2010-08-11 MED ORDER — OXYCODONE-ACETAMINOPHEN 5-325 MG PO TABS: 1.0000 | ORAL_TABLET | ORAL | Status: AC | PRN

## 2010-08-11 NOTE — Discharge Summary (Signed)
Obstetric Discharge Summary Reason for Admission: onset of labor Prenatal Procedures: none Intrapartum Procedures: cesarean: low cervical, transverse Postpartum Procedures: none Complications-Operative and Postpartum: none Hemoglobin  Date Value Range Status  08/09/2010 11.6* 12.0-15.0 (Ochoa/dL) Final     HCT  Date Value Range Status  08/09/2010 35.7* 36.0-46.0 (%) Final    Discharge Diagnoses: Term Pregnancy-delivered  Discharge Information: Date: 08/11/2010 Activity: pelvic rest Diet: routine Medications: PNV, Ibuprophen, Percocet and synthroid Condition: stable Instructions: refer to practice specific booklet Discharge to: home   Newborn Data: Live born female  Birth Weight: 8 lb 8 oz (3856 Ochoa) APGAR: 8, 9  Home with mother.  Katherine Ochoa 08/11/2010, 8:57 AM

## 2010-08-11 NOTE — Progress Notes (Cosign Needed)
Subjective: Postpartum Day 3: Cesarean Delivery Patient reports tolerating PO, + flatus and no problems voiding.    Objective: Vital signs in last 24 hours: Temp:  [97.7 F (36.5 C)-98.1 F (36.7 C)] 98.1 F (36.7 C) (08/01 0652) Pulse Rate:  [62-82] 62  (08/01 0652) Resp:  [18] 18  (08/01 0652) BP: (103-111)/(66-67) 111/67 mmHg (08/01 1610)  Physical Exam:  General: alert and cooperative Lochia: appropriate Uterine Fundus: firm Incision: healing well.Lower abd noted to be slightly erythematous DVT Evaluation: No evidence of DVT seen on physical exam.   Basename 08/09/10 0540  HGB 11.6*  HCT 35.7*    Assessment/Plan: Status post Cesarean section. Doing well postoperatively.  Discharge home with standard precautions and return to clinic in 1-2 days for staple removal and reevaluation of erythema.Patient declined ATB's   Zohan Shiflet G 08/11/2010, 8:51 AM

## 2010-08-11 NOTE — Progress Notes (Signed)
Mother states infant just finished 30 min feeding. Encouraged mother to cue feed to increase milk supply and to increase infants appetite. Parents very receptive to teaching. Encouraged to call lc office if any questions.

## 2010-09-22 NOTE — Addendum Note (Signed)
Addendum  created 09/22/10 1100 by Randa Spike   Modules edited:Notes Section

## 2010-09-22 NOTE — Transfer of Care (Incomplete)
Immediate Anesthesia Transfer of Care Note  Patient: Katherine Ochoa  Procedure(s) Performed:  CESAREAN SECTION - primary of baby boy at 2105  Patient Location: {PLACES; ANE POST:19477::"PACU"}  Anesthesia Type: {PROCEDURES; ANE POST ANESTHESIA TYPE:19480}  Level of Consciousness: {FINDINGS; ANE POST LEVEL OF CONSCIOUSNESS:19484}  Airway & Oxygen Therapy: {Exam; oxygen device:30095}  Post-op Assessment: {ASSESSMENT;POST-OP ZOXWRU:04540}  Post vital signs: {DESC; ANE POST JWJXBJ:47829}  Complications: {FINDINGS; ANE POST COMPLICATIONS:19485}

## 2010-09-22 NOTE — Addendum Note (Signed)
Addendum  created 09/22/10 1121 by Randa Spike   Modules edited:Anesthesia Events

## 2010-10-05 ENCOUNTER — Encounter (HOSPITAL_COMMUNITY): Payer: Self-pay | Admitting: Obstetrics and Gynecology

## 2011-05-23 IMAGING — US US OB COMP LESS 14 WK
1 series · 14 of 28 positions shown · non-contrast
Comparison: None

CLINICAL DATA: 28-year-old pregnant female with estimated gestation
of 6 weeks by LMP.  Vaginal bleeding and pelvic pain.

OBSTETRIC <14 WK US AND TRANSVAGINAL OB US
TECHNIQUE: Both transabdominal and transvaginal ultrasound
examinations were performed for complete evaluation of the
gestation as well as the maternal uterus, adnexal regions, and
pelvic cul-de-sac.  Transvaginal technique was performed to assess
early pregnancy.

[Series 1: us ob comp less 14 wks · 0.25mm/px · 14 of 36 slices shown]
[im 2/36]
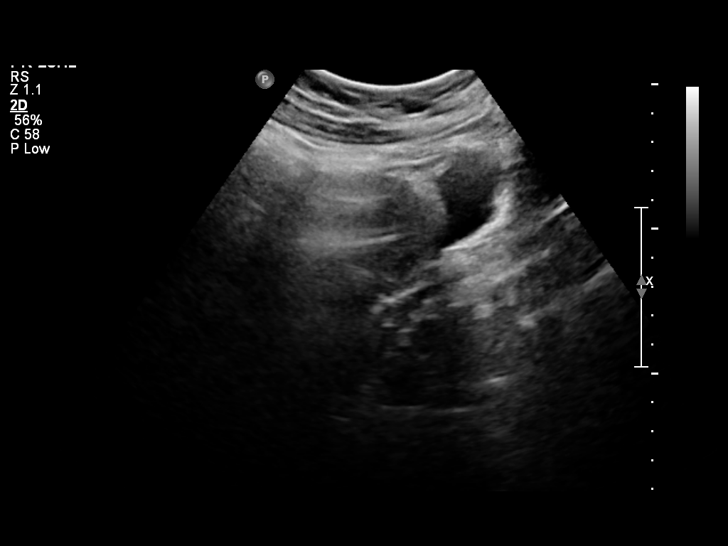
[im 4/36]
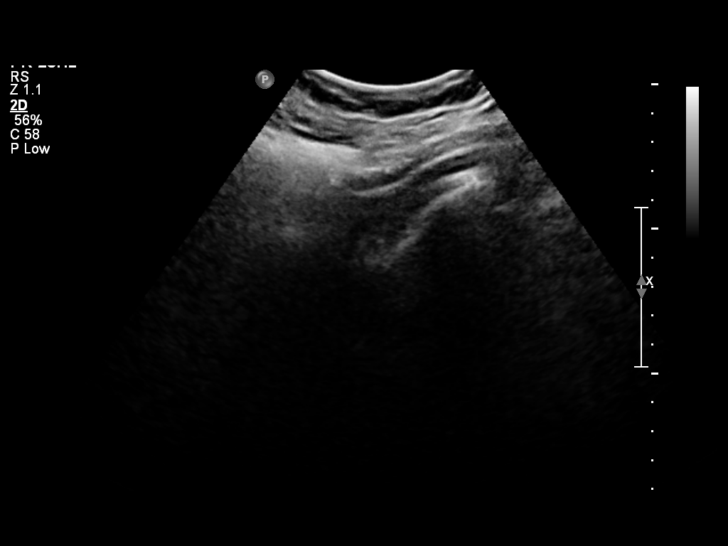
[im 7/36]
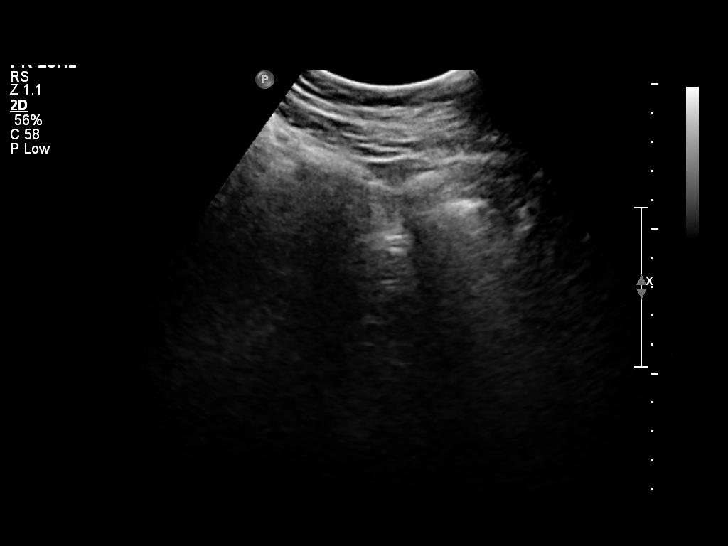
[im 10/36]
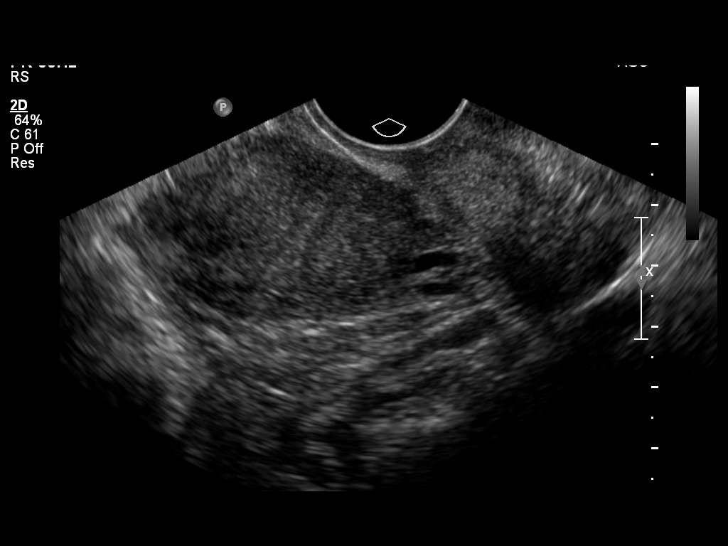
[im 12/36]
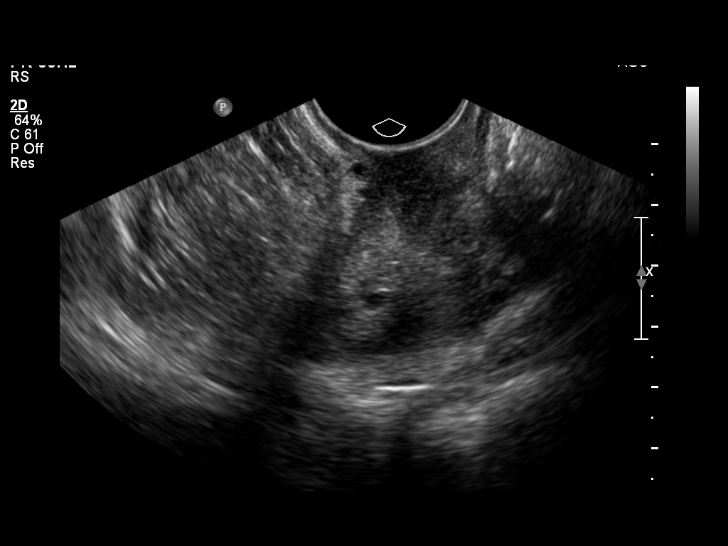
[im 15/36]
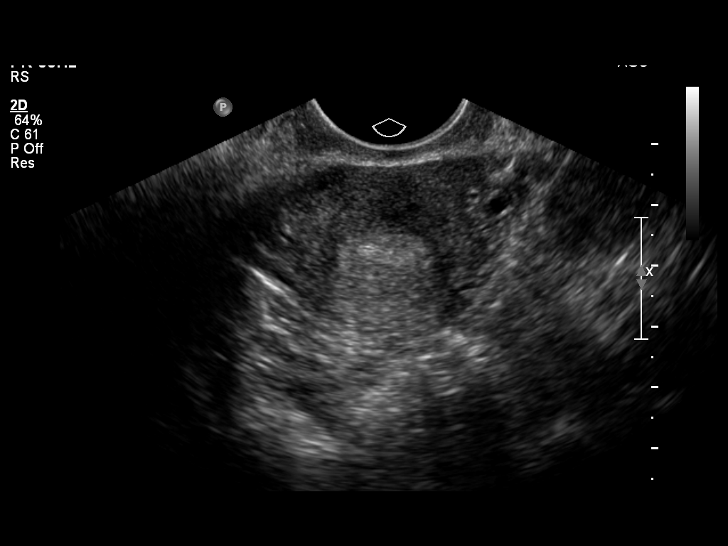
[im 17/36]
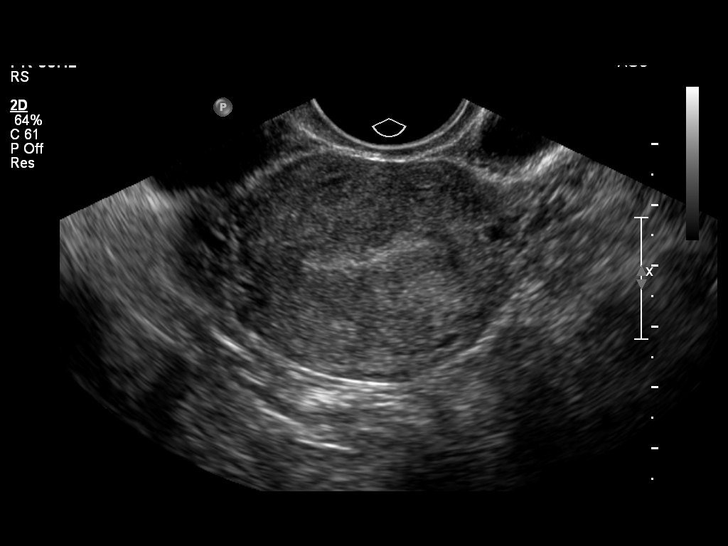
[im 20/36]
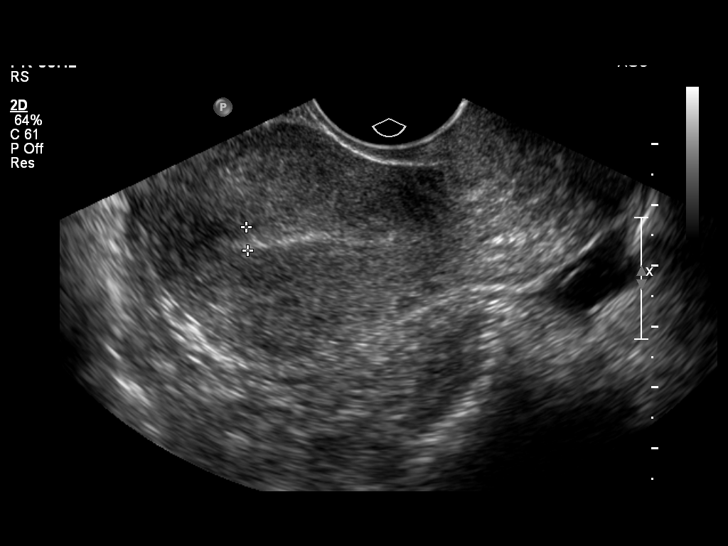
[im 23/36]
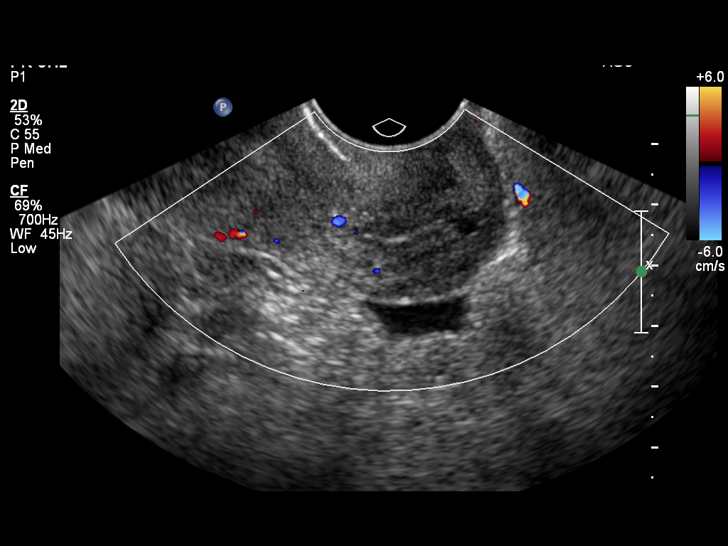
[im 25/36]
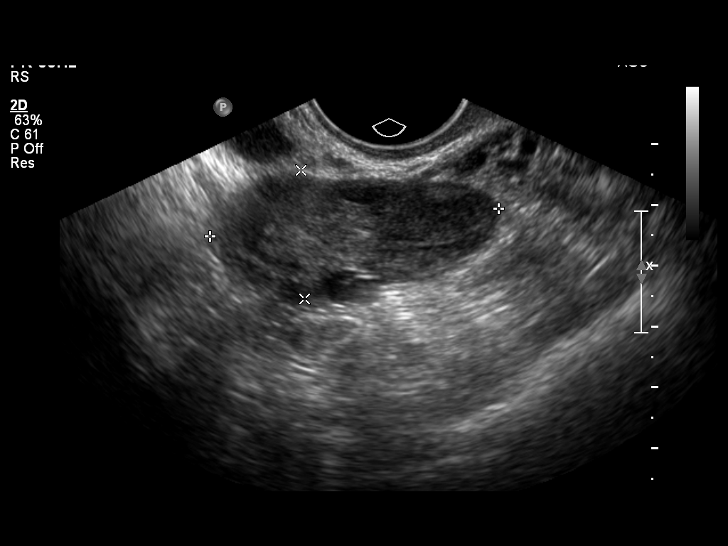
[im 28/36]
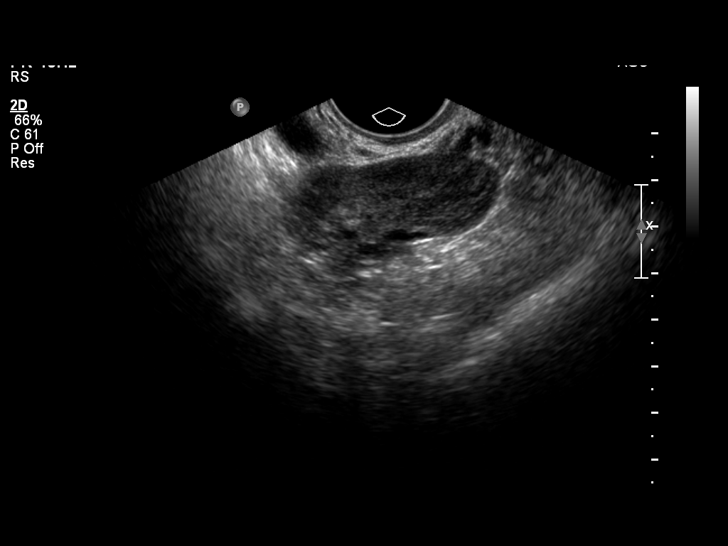
[im 30/36]
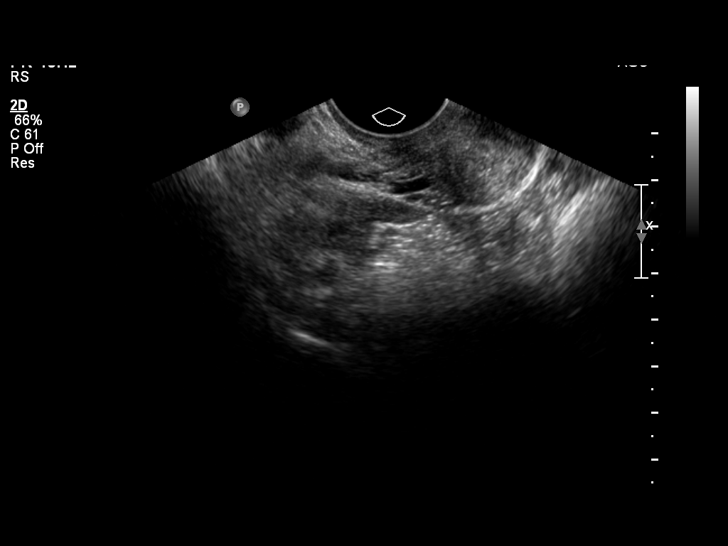
[im 33/36]
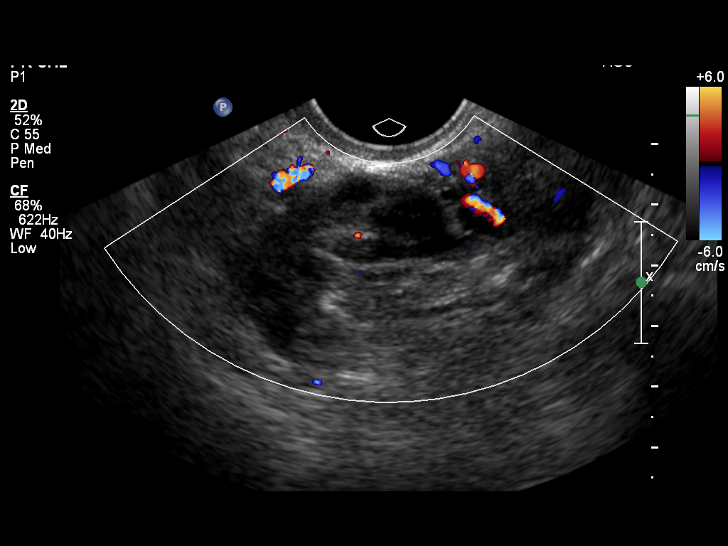
[im 36/36]
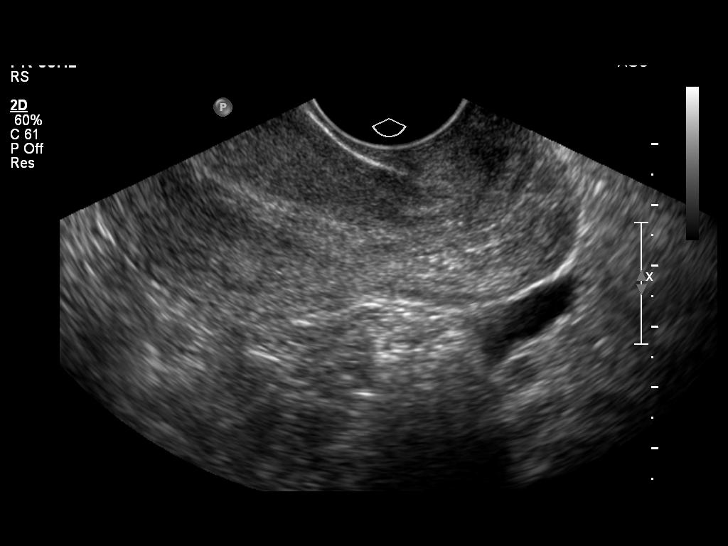

[14 of 28 positions shown; findings below may reference images not displayed]

FINDINGS: The uterus is unremarkable and there is no evidence of intrauterine
pregnancy.
The ovaries bilaterally are normal in appearance and size.
A trace amount of free fluid within the pelvis is noted.
There is no evidence of adnexal mass.
IMPRESSION: No evidence of intrauterine pregnancy.  In light of this patient's
positive pregnancy test, this may represent an normal early
pregnancy or failed first trimester pregnancy. Ectopic pregnancy is
not entirely excluded although there is no evidence of adnexal
mass.  Follow-up is recommended.

Trace amount of free pelvic fluid - nonspecific but may be
physiologic.

## 2012-07-09 LAB — OB RESULTS CONSOLE HIV ANTIBODY (ROUTINE TESTING): HIV: NONREACTIVE

## 2012-07-09 LAB — OB RESULTS CONSOLE RPR: RPR: NONREACTIVE

## 2012-08-02 ENCOUNTER — Other Ambulatory Visit: Payer: Self-pay | Admitting: Obstetrics and Gynecology

## 2012-08-29 ENCOUNTER — Emergency Department (HOSPITAL_COMMUNITY)
Admission: EM | Admit: 2012-08-29 | Discharge: 2012-08-30 | Disposition: A | Discharge status: Home / Self Care | Payer: BC Managed Care – PPO | Attending: Emergency Medicine | Admitting: Emergency Medicine

## 2012-08-29 DIAGNOSIS — T148XXA Other injury of unspecified body region, initial encounter: Secondary | ICD-10-CM

## 2012-08-29 DIAGNOSIS — Y9289 Other specified places as the place of occurrence of the external cause: Secondary | ICD-10-CM | POA: Insufficient documentation

## 2012-08-29 DIAGNOSIS — Z862 Personal history of diseases of the blood and blood-forming organs and certain disorders involving the immune mechanism: Secondary | ICD-10-CM | POA: Insufficient documentation

## 2012-08-29 DIAGNOSIS — S41009A Unspecified open wound of unspecified shoulder, initial encounter: Secondary | ICD-10-CM | POA: Insufficient documentation

## 2012-08-29 DIAGNOSIS — Z23 Encounter for immunization: Secondary | ICD-10-CM | POA: Insufficient documentation

## 2012-08-29 DIAGNOSIS — Z8639 Personal history of other endocrine, nutritional and metabolic disease: Secondary | ICD-10-CM | POA: Insufficient documentation

## 2012-08-29 DIAGNOSIS — Y9301 Activity, walking, marching and hiking: Secondary | ICD-10-CM | POA: Insufficient documentation

## 2012-08-29 DIAGNOSIS — IMO0001 Reserved for inherently not codable concepts without codable children: Secondary | ICD-10-CM | POA: Insufficient documentation

## 2012-08-29 DIAGNOSIS — O9989 Other specified diseases and conditions complicating pregnancy, childbirth and the puerperium: Secondary | ICD-10-CM | POA: Insufficient documentation

## 2012-08-29 MED ORDER — RABIES IMMUNE GLOBULIN 150 UNIT/ML IM INJ
20.0000 [IU]/kg | INJECTION | Freq: Once | INTRAMUSCULAR | Status: AC
  Administered 2012-08-29: 1800 [IU] via INTRAMUSCULAR
  Filled 2012-08-29: qty 12

## 2012-08-29 MED ORDER — RABIES VACCINE, PCEC IM SUSR
1.0000 mL | Freq: Once | INTRAMUSCULAR | Status: AC
  Administered 2012-08-29: 1 mL via INTRAMUSCULAR
  Filled 2012-08-29: qty 1

## 2012-08-29 NOTE — ED Provider Notes (Signed)
CSN: [id number]     Arrival date & time 08/29/12  2252 History  This chart was scribed for non-physician practitioner Kyung Bacca, PA-C, working with Sunnie Nielsen, MD, by Yevette Edwards, ED Scribe. This patient was seen in room WTR7/WTR7 and the patient's care was started at 11:03 PM   First MD Initiated Contact with Patient 08/29/12 2258     No chief complaint on file.   The history is provided by the patient. No language interpreter was used.   HPI Comments:  Katherine Ochoa is a 32 y.o. female, who is [redacted] weeks pregnant, who presents to the Emergency Department complaining of a suspected bat bite to her right posterior shoulder. The suspected bite occurred PTA this evening while she was walking in her neighborhood. She had seen bats near the area prior to being bitten, but she did not see, feel, nor hear a bat bite her. The pt did feel a very light sting and then noticed a small amount of blood to the area. Her last tetanus was in 2010.   Past Medical History  Diagnosis Date  . Hypothyroidism    Past Surgical History  Procedure Laterality Date  . Wisdom tooth extraction    . Cesarean section  08/08/2010    Procedure: CESAREAN SECTION;  Surgeon: Zelphia Cairo;  Location: [facility];  Service: Gynecology;  Laterality: N/A;  primary of baby boy at 2105   Family History  Problem Relation Age of Onset  . Diabetes Father   . Stroke Father   . Heart disease Father    History  Substance Use Topics  . Smoking status: Never Smoker   . Smokeless tobacco: Never Used  . Alcohol Use: No   OB History   Grav Para Term Preterm Abortions TAB SAB Ect Mult Living   3 2 1  1  1   1      Review of Systems  Skin: Positive for wound.  All other systems reviewed and are negative.    Allergies  Review of patient's allergies indicates no known allergies.  Home Medications   Current Outpatient Rx  Name  Route  Sig  Dispense  Refill  . prenatal vitamin w/FE, FA (PRENATAL 1 + 1) 27-1 MG  TABS   Oral   Take 1 tablet by mouth daily.            Triage Vitals:  BP 117/88  Pulse 89  Temp(Src) 99.3 F (37.4 C) (Oral)  Resp 16  Ht 5\' 6"  (1.676 m)  Wt 195 lb (88.451 kg)  BMI 31.49 kg/m2  SpO2 100%  Physical Exam  Nursing note and vitals reviewed. Constitutional: She is oriented to person, place, and time. She appears well-developed and well-nourished. No distress.  HENT:  Head: Normocephalic and atraumatic.  Eyes:  Normal appearance  Neck: Normal range of motion.  Pulmonary/Chest: Effort normal.  Musculoskeletal: Normal range of motion.  Neurological: She is alert and oriented to person, place, and time.  Skin:  Pinpoint, hemostatic wound to right posterior shoulder.  No surrounding edema, erythema or induration.  Non-tender.   Psychiatric: She has a normal mood and affect. Her behavior is normal.    ED Course   DIAGNOSTIC STUDIES:  Oxygen Saturation is 100% on room air, normal by my interpretation.    COORDINATION OF CARE:  11:06 PM- Discussed treatment plan with patient, and the patient agreed to the plan.   Procedures (including critical care time)  Labs Reviewed - No data to display  No results found. 1. Animal bite     MDM  31yo healthy F in second trimester of pregnancy presents w/ possible bat bite.  Pinpoint, hemostatic wound  to R posterior shoulder on exam.  Pt treated w/ rabies vaccine and immune globulin.  Consulted Pharmacist who says there have never been any studies to evaluate for adverse effects to fetus, but the benefits outweigh the risks.  Patient's gynecology office told her the same.    I personally performed the services described in this documentation, which was scribed in my presence. The recorded information has been reviewed and is accurate.    Otilio Miu, PA-C 08/30/12 0002

## 2012-08-29 NOTE — ED Notes (Signed)
Pt states she was out walking at dusk and there were some bats flying around and pt felt something on her shoulder. Pt states she didn't feel like something bit her, but there was a tiny drop of blood on her R shoulder. Pt is worried about being exposed to rabies. Pt is also [redacted] weeks pregnant and is concerned about getting a rabies vaccination while being pregnant.

## 2012-08-30 NOTE — ED Provider Notes (Signed)
Medical screening examination/treatment/procedure(s) were performed by non-physician practitioner and as supervising physician I was immediately available for consultation/collaboration.  Fraida Veldman, MD 08/30/12 0403 

## 2012-09-02 ENCOUNTER — Encounter (HOSPITAL_COMMUNITY): Payer: Self-pay | Admitting: *Deleted

## 2012-09-02 ENCOUNTER — Emergency Department (INDEPENDENT_AMBULATORY_CARE_PROVIDER_SITE_OTHER)
Admission: EM | Admit: 2012-09-02 | Discharge: 2012-09-02 | Disposition: A | Discharge status: Home / Self Care | Payer: BC Managed Care – PPO | Source: Home / Self Care

## 2012-09-02 DIAGNOSIS — Z203 Contact with and (suspected) exposure to rabies: Secondary | ICD-10-CM

## 2012-09-02 MED ORDER — RABIES VACCINE, PCEC IM SUSR
1.0000 mL | Freq: Once | INTRAMUSCULAR | Status: AC
  Administered 2012-09-02: 1 mL via INTRAMUSCULAR

## 2012-09-02 MED ORDER — RABIES VACCINE, PCEC IM SUSR
INTRAMUSCULAR | Status: AC
  Filled 2012-09-02: qty 1

## 2012-09-02 NOTE — ED Notes (Signed)
Patient instructions given to return to clinic in 3 days, August 28,  for injection number 3.

## 2012-09-02 NOTE — ED Notes (Signed)
Patient presents for day 3 of rabies vaccination.

## 2012-09-06 ENCOUNTER — Encounter (HOSPITAL_COMMUNITY): Payer: Self-pay | Admitting: *Deleted

## 2012-09-06 ENCOUNTER — Emergency Department (HOSPITAL_COMMUNITY)
Admission: EM | Admit: 2012-09-06 | Discharge: 2012-09-06 | Disposition: A | Discharge status: Home / Self Care | Payer: BC Managed Care – PPO | Source: Home / Self Care

## 2012-09-06 MED ORDER — RABIES VACCINE, PCEC IM SUSR
INTRAMUSCULAR | Status: AC
  Filled 2012-09-06: qty 1

## 2012-09-06 MED ORDER — RABIES VACCINE, PCEC IM SUSR
1.0000 mL | Freq: Once | INTRAMUSCULAR | Status: AC
  Administered 2012-09-06: 1 mL via INTRAMUSCULAR

## 2012-09-06 NOTE — ED Notes (Signed)
Pt    Here  For  The  Next  Rabies   Injection          -   She   Verbalizes  No  Symptoms

## 2012-09-13 ENCOUNTER — Emergency Department (INDEPENDENT_AMBULATORY_CARE_PROVIDER_SITE_OTHER)
Admission: EM | Admit: 2012-09-13 | Discharge: 2012-09-13 | Disposition: A | Discharge status: Home / Self Care | Payer: BC Managed Care – PPO | Source: Home / Self Care

## 2012-09-13 ENCOUNTER — Encounter (HOSPITAL_COMMUNITY): Payer: Self-pay | Admitting: Emergency Medicine

## 2012-09-13 DIAGNOSIS — Z203 Contact with and (suspected) exposure to rabies: Secondary | ICD-10-CM

## 2012-09-13 MED ORDER — RABIES VACCINE, PCEC IM SUSR
INTRAMUSCULAR | Status: AC
  Filled 2012-09-13: qty 1

## 2012-09-13 NOTE — ED Notes (Signed)
Rabies vaccination.   

## 2013-01-29 NOTE — H&P (Signed)
Katherine Ochoa is a 33 y.o. female presenting for Repeat C Section. History OB History   Grav Para Term Preterm Abortions TAB SAB Ect Mult Living   4 2 1  1  1   1      Past Medical History  Diagnosis Date  . Hypothyroidism    Past Surgical History  Procedure Laterality Date  . Wisdom tooth extraction    . Cesarean section  08/08/2010    Procedure: CESAREAN SECTION;  Surgeon: Zelphia CairoGretchen Adkins;  Location: [facility];  Service: Gynecology;  Laterality: N/A;  primary of baby boy at 2105   Family History: family history includes Diabetes in her father; Heart disease in her father; Stroke in her father. Social History:  reports that she has never smoked. She has never used smokeless tobacco. She reports that she does not drink alcohol or use illicit drugs.   Prenatal Transfer Tool  Maternal Diabetes: No Genetic Screening: Normal Maternal Ultrasounds/Referrals: Normal Fetal Ultrasounds or other Referrals:  None Maternal Substance Abuse:  No Significant Maternal Medications:  None Significant Maternal Lab Results:  None Other Comments:  None  Review of Systems  All other systems reviewed and are negative.      There were no vitals taken for this visit. Maternal Exam:  Abdomen: Fetal presentation: vertex     Physical Exam  Nursing note and vitals reviewed. Constitutional: She appears well-developed.  HENT:  Head: Normocephalic.  Eyes: Pupils are equal, round, and reactive to light.  Neck: Normal range of motion.  Cardiovascular: Normal rate.   Respiratory: Effort normal.  GI: Soft.  Genitourinary: Vagina normal.    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

## 2013-01-31 ENCOUNTER — Encounter (HOSPITAL_COMMUNITY): Payer: Self-pay | Admitting: Pharmacist

## 2013-02-07 ENCOUNTER — Encounter (HOSPITAL_COMMUNITY): Payer: Self-pay

## 2013-02-07 ENCOUNTER — Encounter (HOSPITAL_COMMUNITY)
Admission: RE | Admit: 2013-02-07 | Discharge: 2013-02-07 | Disposition: A | Discharge status: Home / Self Care | Payer: BC Managed Care – PPO | Source: Ambulatory Visit | Attending: Obstetrics and Gynecology | Admitting: Obstetrics and Gynecology

## 2013-02-07 LAB — CBC
HCT: 35.8 % — ABNORMAL LOW (ref 36.0–46.0)
Hemoglobin: 11.8 g/dL — ABNORMAL LOW (ref 12.0–15.0)
MCH: 29.4 pg (ref 26.0–34.0)
MCHC: 33 g/dL (ref 30.0–36.0)
MCV: 89.3 fL (ref 78.0–100.0)
PLATELETS: 171 10*3/uL (ref 150–400)
RBC: 4.01 MIL/uL (ref 3.87–5.11)
RDW: 13.9 % (ref 11.5–15.5)
WBC: 9.6 10*3/uL (ref 4.0–10.5)

## 2013-02-07 LAB — RPR: RPR Ser Ql: NONREACTIVE

## 2013-02-07 NOTE — Patient Instructions (Signed)
20 Trellis Paganiniina M Gaw  02/07/2013   Your procedure is scheduled on:  02/08/13  [facility] at 12 Noon  Pick up the phone at the desk and dial [phone].   Call this number if you have problems the morning of surgery: [phone]   Remember:   Do not eat food:After Midnight.  Do not drink clear liquids: 4 Hours before arrival.  Take these medicines the morning of surgery with A SIP OF WATER: NA   Do not wear jewelry, make-up or nail polish.  Do not wear lotions, powders, or perfumes. You may wear deodorant.  Do not shave 48 hours prior to surgery.  Do not bring valuables to the hospital.  [facility] is not   responsible for any belongings or valuables brought to the hospital.  Contacts, dentures or bridgework may not be worn into surgery.  Leave suitcase in the car. After surgery it may be brought to your room.  For patients admitted to the hospital, checkout time is 11:00 AM the day of              discharge.   Patients discharged the day of surgery will not be allowed to drive             home.  Name and phone number of your driver: NA  Special Instructions:   Shower using CHG 2 nights before surgery and the night before surgery.  If you shower the day of surgery use CHG.  Use special wash - you have one bottle of CHG for all showers.  You should use approximately 1/3 of the bottle for each shower.   Please read over the following fact sheets that you were given:   Surgical Site Infection Prevention

## 2013-02-08 ENCOUNTER — Inpatient Hospital Stay (HOSPITAL_COMMUNITY): Payer: BC Managed Care – PPO | Admitting: Anesthesiology

## 2013-02-08 ENCOUNTER — Inpatient Hospital Stay (HOSPITAL_COMMUNITY)
Admission: RE | Admit: 2013-02-08 | Discharge: 2013-02-10 | DRG: 766 | Disposition: A | Discharge status: Home / Self Care | Payer: BC Managed Care – PPO | Source: Ambulatory Visit | Attending: Obstetrics and Gynecology | Admitting: Obstetrics and Gynecology

## 2013-02-08 ENCOUNTER — Encounter (HOSPITAL_COMMUNITY): Payer: BC Managed Care – PPO | Admitting: Anesthesiology

## 2013-02-08 ENCOUNTER — Encounter (HOSPITAL_COMMUNITY): Payer: Self-pay | Admitting: *Deleted

## 2013-02-08 ENCOUNTER — Encounter (HOSPITAL_COMMUNITY)
Admission: RE | Disposition: A | Discharge status: Home / Self Care | Payer: Self-pay | Source: Ambulatory Visit | Attending: Obstetrics and Gynecology

## 2013-02-08 DIAGNOSIS — O99284 Endocrine, nutritional and metabolic diseases complicating childbirth: Secondary | ICD-10-CM

## 2013-02-08 DIAGNOSIS — O99214 Obesity complicating childbirth: Secondary | ICD-10-CM

## 2013-02-08 DIAGNOSIS — E669 Obesity, unspecified: Secondary | ICD-10-CM | POA: Diagnosis present

## 2013-02-08 DIAGNOSIS — E039 Hypothyroidism, unspecified: Secondary | ICD-10-CM | POA: Diagnosis present

## 2013-02-08 DIAGNOSIS — O34219 Maternal care for unspecified type scar from previous cesarean delivery: Principal | ICD-10-CM | POA: Diagnosis present

## 2013-02-08 DIAGNOSIS — Z98891 History of uterine scar from previous surgery: Secondary | ICD-10-CM

## 2013-02-08 DIAGNOSIS — Z6837 Body mass index (BMI) 37.0-37.9, adult: Secondary | ICD-10-CM

## 2013-02-08 DIAGNOSIS — E079 Disorder of thyroid, unspecified: Secondary | ICD-10-CM | POA: Diagnosis present

## 2013-02-08 SURGERY — Surgical Case
Anesthesia: Spinal

## 2013-02-08 MED ORDER — ONDANSETRON HCL 4 MG/2ML IJ SOLN
INTRAMUSCULAR | Status: DC | PRN
  Administered 2013-02-08: 4 mg via INTRAVENOUS

## 2013-02-08 MED ORDER — KETOROLAC TROMETHAMINE 60 MG/2ML IM SOLN
INTRAMUSCULAR | Status: AC
  Filled 2013-02-08: qty 2

## 2013-02-08 MED ORDER — LACTATED RINGERS IV SOLN: INTRAVENOUS | Status: DC

## 2013-02-08 MED ORDER — BUPIVACAINE HCL (PF) 0.25 % IJ SOLN
INTRAMUSCULAR | Status: AC
  Filled 2013-02-08: qty 20

## 2013-02-08 MED ORDER — CEFAZOLIN SODIUM-DEXTROSE 2-3 GM-% IV SOLR
INTRAVENOUS | Status: AC
  Administered 2013-02-08: 2 g via INTRAVENOUS
  Filled 2013-02-08: qty 50

## 2013-02-08 MED ORDER — WITCH HAZEL-GLYCERIN EX PADS: 1.0000 "application " | MEDICATED_PAD | CUTANEOUS | Status: DC | PRN

## 2013-02-08 MED ORDER — PHENYLEPHRINE 8 MG IN D5W 100 ML (0.08MG/ML) PREMIX OPTIME
INJECTION | INTRAVENOUS | Status: DC | PRN
  Administered 2013-02-08: 60 ug/min via INTRAVENOUS

## 2013-02-08 MED ORDER — LACTATED RINGERS IV SOLN
INTRAVENOUS | Status: DC | PRN
  Administered 2013-02-08: 14:00:00 via INTRAVENOUS

## 2013-02-08 MED ORDER — IBUPROFEN 600 MG PO TABS: 600.0000 mg | ORAL_TABLET | Freq: Four times a day (QID) | ORAL | Status: DC

## 2013-02-08 MED ORDER — FENTANYL CITRATE 0.05 MG/ML IJ SOLN
INTRAMUSCULAR | Status: AC
  Filled 2013-02-08: qty 2

## 2013-02-08 MED ORDER — ONDANSETRON HCL 4 MG/2ML IJ SOLN: 4.0000 mg | Freq: Three times a day (TID) | INTRAMUSCULAR | Status: DC | PRN

## 2013-02-08 MED ORDER — OXYTOCIN 10 UNIT/ML IJ SOLN
40.0000 [IU] | INTRAVENOUS | Status: DC | PRN
  Administered 2013-02-08: 40 [IU] via INTRAVENOUS

## 2013-02-08 MED ORDER — SENNOSIDES-DOCUSATE SODIUM 8.6-50 MG PO TABS
2.0000 | ORAL_TABLET | ORAL | Status: DC
Start: 2013-02-09 — End: 2013-02-10
  Administered 2013-02-08 – 2013-02-10 (×2): 2 via ORAL
  Filled 2013-02-08 (×2): qty 2

## 2013-02-08 MED ORDER — DIPHENHYDRAMINE HCL 50 MG/ML IJ SOLN: 25.0000 mg | INTRAMUSCULAR | Status: DC | PRN

## 2013-02-08 MED ORDER — NALOXONE HCL 1 MG/ML IJ SOLN
1.0000 ug/kg/h | INTRAMUSCULAR | Status: DC | PRN
  Filled 2013-02-08: qty 2

## 2013-02-08 MED ORDER — MEPERIDINE HCL 25 MG/ML IJ SOLN: 6.2500 mg | INTRAMUSCULAR | Status: DC | PRN

## 2013-02-08 MED ORDER — LANOLIN HYDROUS EX OINT: 1.0000 "application " | TOPICAL_OINTMENT | CUTANEOUS | Status: DC | PRN

## 2013-02-08 MED ORDER — DIPHENHYDRAMINE HCL 25 MG PO CAPS: 25.0000 mg | ORAL_CAPSULE | Freq: Four times a day (QID) | ORAL | Status: DC | PRN

## 2013-02-08 MED ORDER — MORPHINE SULFATE 0.5 MG/ML IJ SOLN
INTRAMUSCULAR | Status: AC
  Filled 2013-02-08: qty 10

## 2013-02-08 MED ORDER — BUPIVACAINE HCL (PF) 0.25 % IJ SOLN
INTRAMUSCULAR | Status: AC
  Filled 2013-02-08: qty 10

## 2013-02-08 MED ORDER — DIPHENHYDRAMINE HCL 50 MG/ML IJ SOLN: 12.5000 mg | INTRAMUSCULAR | Status: DC | PRN

## 2013-02-08 MED ORDER — DIBUCAINE 1 % RE OINT: 1.0000 "application " | TOPICAL_OINTMENT | RECTAL | Status: DC | PRN

## 2013-02-08 MED ORDER — MENTHOL 3 MG MT LOZG: 1.0000 | LOZENGE | OROMUCOSAL | Status: DC | PRN

## 2013-02-08 MED ORDER — IBUPROFEN 600 MG PO TABS
600.0000 mg | ORAL_TABLET | Freq: Four times a day (QID) | ORAL | Status: DC
  Administered 2013-02-08 – 2013-02-10 (×7): 600 mg via ORAL
  Filled 2013-02-08 (×8): qty 1

## 2013-02-08 MED ORDER — ONDANSETRON HCL 4 MG/2ML IJ SOLN
INTRAMUSCULAR | Status: AC
  Filled 2013-02-08: qty 2

## 2013-02-08 MED ORDER — SODIUM CHLORIDE 0.9 % IJ SOLN: 3.0000 mL | INTRAMUSCULAR | Status: DC | PRN

## 2013-02-08 MED ORDER — LACTATED RINGERS IV SOLN
INTRAVENOUS | Status: DC
  Administered 2013-02-08 (×2): via INTRAVENOUS

## 2013-02-08 MED ORDER — DIPHENHYDRAMINE HCL 25 MG PO CAPS
25.0000 mg | ORAL_CAPSULE | ORAL | Status: DC | PRN
  Filled 2013-02-08: qty 1

## 2013-02-08 MED ORDER — OXYTOCIN 40 UNITS IN LACTATED RINGERS INFUSION - SIMPLE MED
62.5000 mL/h | INTRAVENOUS | Status: AC
Start: 2013-02-08 — End: 2013-02-09

## 2013-02-08 MED ORDER — TETANUS-DIPHTH-ACELL PERTUSSIS 5-2.5-18.5 LF-MCG/0.5 IM SUSP: 0.5000 mL | Freq: Once | INTRAMUSCULAR | Status: DC

## 2013-02-08 MED ORDER — MORPHINE SULFATE (PF) 0.5 MG/ML IJ SOLN
INTRAMUSCULAR | Status: DC | PRN
Start: 2013-02-08 — End: 2013-02-08
  Administered 2013-02-08: .15 mg via INTRATHECAL

## 2013-02-08 MED ORDER — KETOROLAC TROMETHAMINE 30 MG/ML IJ SOLN
30.0000 mg | Freq: Four times a day (QID) | INTRAMUSCULAR | Status: AC | PRN
Start: 2013-02-08 — End: 2013-02-09

## 2013-02-08 MED ORDER — PHENYLEPHRINE 8 MG IN D5W 100 ML (0.08MG/ML) PREMIX OPTIME
INJECTION | INTRAVENOUS | Status: AC
  Filled 2013-02-08: qty 100

## 2013-02-08 MED ORDER — OXYTOCIN 10 UNIT/ML IJ SOLN
INTRAMUSCULAR | Status: AC
  Filled 2013-02-08: qty 4

## 2013-02-08 MED ORDER — PRENATAL MULTIVITAMIN CH
1.0000 | ORAL_TABLET | Freq: Every day | ORAL | Status: DC
Start: 2013-02-09 — End: 2013-02-10
  Administered 2013-02-09 – 2013-02-10 (×2): 1 via ORAL
  Filled 2013-02-08 (×2): qty 1

## 2013-02-08 MED ORDER — KETOROLAC TROMETHAMINE 30 MG/ML IJ SOLN: 30.0000 mg | Freq: Four times a day (QID) | INTRAMUSCULAR | Status: AC | PRN

## 2013-02-08 MED ORDER — SCOPOLAMINE 1 MG/3DAYS TD PT72: 1.0000 | MEDICATED_PATCH | Freq: Once | TRANSDERMAL | Status: DC

## 2013-02-08 MED ORDER — NALOXONE HCL 0.4 MG/ML IJ SOLN: 0.4000 mg | INTRAMUSCULAR | Status: DC | PRN

## 2013-02-08 MED ORDER — NALBUPHINE HCL 10 MG/ML IJ SOLN: 5.0000 mg | INTRAMUSCULAR | Status: DC | PRN

## 2013-02-08 MED ORDER — ONDANSETRON HCL 4 MG PO TABS: 4.0000 mg | ORAL_TABLET | ORAL | Status: DC | PRN

## 2013-02-08 MED ORDER — FENTANYL CITRATE 0.05 MG/ML IJ SOLN
INTRAMUSCULAR | Status: DC | PRN
  Administered 2013-02-08: 25 ug via INTRATHECAL

## 2013-02-08 MED ORDER — LACTATED RINGERS IV SOLN
INTRAVENOUS | Status: DC
  Administered 2013-02-08: 20:00:00 via INTRAVENOUS

## 2013-02-08 MED ORDER — OXYCODONE-ACETAMINOPHEN 5-325 MG PO TABS: 1.0000 | ORAL_TABLET | ORAL | Status: DC | PRN

## 2013-02-08 MED ORDER — SCOPOLAMINE 1 MG/3DAYS TD PT72
MEDICATED_PATCH | TRANSDERMAL | Status: AC
  Administered 2013-02-08: 1.5 mg via TRANSDERMAL
  Filled 2013-02-08: qty 1

## 2013-02-08 MED ORDER — METOCLOPRAMIDE HCL 5 MG/ML IJ SOLN: 10.0000 mg | Freq: Three times a day (TID) | INTRAMUSCULAR | Status: DC | PRN

## 2013-02-08 MED ORDER — SCOPOLAMINE 1 MG/3DAYS TD PT72
1.0000 | MEDICATED_PATCH | Freq: Once | TRANSDERMAL | Status: DC
  Administered 2013-02-08: 1.5 mg via TRANSDERMAL

## 2013-02-08 MED ORDER — SIMETHICONE 80 MG PO CHEW
80.0000 mg | CHEWABLE_TABLET | ORAL | Status: DC
  Administered 2013-02-08 – 2013-02-10 (×2): 80 mg via ORAL
  Filled 2013-02-08 (×2): qty 1

## 2013-02-08 MED ORDER — ONDANSETRON HCL 4 MG/2ML IJ SOLN: 4.0000 mg | INTRAMUSCULAR | Status: DC | PRN

## 2013-02-08 MED ORDER — FENTANYL CITRATE 0.05 MG/ML IJ SOLN: 25.0000 ug | INTRAMUSCULAR | Status: DC | PRN

## 2013-02-08 MED ORDER — SIMETHICONE 80 MG PO CHEW
80.0000 mg | CHEWABLE_TABLET | Freq: Three times a day (TID) | ORAL | Status: DC
Start: 2013-02-08 — End: 2013-02-10
  Administered 2013-02-08 – 2013-02-10 (×5): 80 mg via ORAL
  Filled 2013-02-08 (×5): qty 1

## 2013-02-08 MED ORDER — METOCLOPRAMIDE HCL 5 MG/ML IJ SOLN: 10.0000 mg | Freq: Once | INTRAMUSCULAR | Status: DC | PRN

## 2013-02-08 MED ORDER — KETOROLAC TROMETHAMINE 60 MG/2ML IM SOLN
60.0000 mg | Freq: Once | INTRAMUSCULAR | Status: AC | PRN
  Administered 2013-02-08: 60 mg via INTRAMUSCULAR

## 2013-02-08 MED ORDER — SIMETHICONE 80 MG PO CHEW: 80.0000 mg | CHEWABLE_TABLET | ORAL | Status: DC | PRN

## 2013-02-08 MED ORDER — CEFAZOLIN SODIUM-DEXTROSE 2-3 GM-% IV SOLR: 2.0000 g | INTRAVENOUS | Status: DC

## 2013-02-08 MED ORDER — ZOLPIDEM TARTRATE 5 MG PO TABS: 5.0000 mg | ORAL_TABLET | Freq: Every evening | ORAL | Status: DC | PRN

## 2013-02-08 SURGICAL SUPPLY — 31 items
BARRIER ADHS 3X4 INTERCEED (GAUZE/BANDAGES/DRESSINGS) IMPLANT
CLAMP CORD UMBIL (MISCELLANEOUS) IMPLANT
CLOTH BEACON ORANGE TIMEOUT ST (SAFETY) ×3 IMPLANT
CONTAINER PREFILL 10% NBF 15ML (MISCELLANEOUS) IMPLANT
DRAPE LG THREE QUARTER DISP (DRAPES) IMPLANT
DRSG OPSITE POSTOP 4X10 (GAUZE/BANDAGES/DRESSINGS) ×3 IMPLANT
DURAPREP 26ML APPLICATOR (WOUND CARE) ×3 IMPLANT
ELECT REM PT RETURN 9FT ADLT (ELECTROSURGICAL) ×3
ELECTRODE REM PT RTRN 9FT ADLT (ELECTROSURGICAL) ×1 IMPLANT
EXTRACTOR VACUUM M CUP 4 TUBE (SUCTIONS) IMPLANT
EXTRACTOR VACUUM M CUP 4' TUBE (SUCTIONS)
GLOVE BIO SURGEON STRL SZ 6.5 (GLOVE) ×2 IMPLANT
GLOVE BIO SURGEONS STRL SZ 6.5 (GLOVE) ×1
GOWN STRL REUS W/TWL LRG LVL3 (GOWN DISPOSABLE) ×6 IMPLANT
KIT ABG SYR 3ML LUER SLIP (SYRINGE) IMPLANT
NEEDLE HYPO 22GX1.5 SAFETY (NEEDLE) IMPLANT
NEEDLE HYPO 25X5/8 SAFETYGLIDE (NEEDLE) ×3 IMPLANT
NS IRRIG 1000ML POUR BTL (IV SOLUTION) ×3 IMPLANT
PACK C SECTION WH (CUSTOM PROCEDURE TRAY) ×3 IMPLANT
PAD OB MATERNITY 4.3X12.25 (PERSONAL CARE ITEMS) ×3 IMPLANT
STAPLER VISISTAT 35W (STAPLE) IMPLANT
SUT CHROMIC 0 CTX 36 (SUTURE) ×6 IMPLANT
SUT PLAIN 0 NONE (SUTURE) IMPLANT
SUT PLAIN 2 0 XLH (SUTURE) IMPLANT
SUT VIC AB 0 CT1 27 (SUTURE) ×8
SUT VIC AB 0 CT1 27XBRD ANBCTR (SUTURE) ×4 IMPLANT
SUT VIC AB 4-0 KS 27 (SUTURE) ×3 IMPLANT
SYR CONTROL 10ML LL (SYRINGE) IMPLANT
TOWEL OR 17X24 6PK STRL BLUE (TOWEL DISPOSABLE) ×3 IMPLANT
TRAY FOLEY CATH 14FR (SET/KITS/TRAYS/PACK) ×3 IMPLANT
WATER STERILE IRR 1000ML POUR (IV SOLUTION) ×3 IMPLANT

## 2013-02-08 NOTE — Anesthesia Preprocedure Evaluation (Signed)
Anesthesia Evaluation  Patient identified by MRN, date of birth, ID band Patient awake    Reviewed: Allergy & Precautions, H&P , Patient's Chart, lab work & pertinent test results  Airway       Dental no notable dental hx.    Pulmonary neg pulmonary ROS,  breath sounds clear to auscultation  Pulmonary exam normal       Cardiovascular negative cardio ROS  Rhythm:regular Rate:Normal     Neuro/Psych negative neurological ROS  negative psych ROS   GI/Hepatic negative GI ROS, Neg liver ROS,   Endo/Other  Hypothyroidism (no meds) Obese - BMI 37.2  Renal/GU negative Renal ROS     Musculoskeletal   Abdominal   Peds  Hematology  (+) JEHOVAH'S WITNESS  Anesthesia Other Findings   Reproductive/Obstetrics (+) Pregnancy (h/o prior c/s, for repeat)                           Anesthesia Physical  Anesthesia Plan  ASA: II  Anesthesia Plan: Spinal   Post-op Pain Management:    Induction:   Airway Management Planned:   Additional Equipment:   Intra-op Plan:   Post-operative Plan:   Informed Consent: I have reviewed the patients History and Physical, chart, labs and discussed the procedure including the risks, benefits and alternatives for the proposed anesthesia with the patient or authorized representative who has indicated his/her understanding and acceptance.     Plan Discussed with: Surgeon and CRNA  Anesthesia Plan Comments:         Anesthesia Quick Evaluation

## 2013-02-08 NOTE — Progress Notes (Signed)
History and physical on the chart No significant changes Will proceed with Repeat LTCS No Complications

## 2013-02-08 NOTE — Transfer of Care (Signed)
Immediate Anesthesia Transfer of Care Note  Patient: Katherine Ochoa  Procedure(s) Performed: Procedure(s) with comments: REPEAT CESAREAN SECTION (N/A) - Repeat edc 02/15/13  Patient Location: PACU  Anesthesia Type:Spinal  Level of Consciousness: awake, alert  and oriented  Airway & Oxygen Therapy: Patient Spontanous Breathing  Post-op Assessment: Report given to PACU RN and Post -op Vital signs reviewed and stable  Post vital signs: Reviewed and stable  Complications: No apparent anesthesia complications

## 2013-02-08 NOTE — Anesthesia Procedure Notes (Signed)

## 2013-02-08 NOTE — Brief Op Note (Signed)
02/08/2013  2:28 PM  PATIENT:  Theotis Burrowina M Stryker  33 y.o. female  PRE-OPERATIVE DIAGNOSIS: IUP at term and Previous C Section  POST-OPERATIVE DIAGNOSIS:  Same  PROCEDURE:  Procedure(s) with comments: REPEAT CESAREAN SECTION (N/A) - Repeat edc 02/15/13  SURGEON:  Surgeon(s) and Role:    * Jeani HawkingMichelle L Countess Biebel, MD - Primary  PHYSICIAN ASSISTANT:   ASSISTANTS: none   ANESTHESIA:   spinal  EBL:  Total I/O In: 2000 [I.V.:2000] Out: 1050 [Urine:250; Blood:800]  BLOOD ADMINISTERED:none  DRAINS: Urinary Catheter (Foley)   LOCAL MEDICATIONS USED:  NONE  SPECIMEN:  No Specimen  DISPOSITION OF SPECIMEN:  N/A  COUNTS:  YES  TOURNIQUET:  * No tourniquets in log *  DICTATION: .Other Dictation: Dictation Number [id number]  PLAN OF CARE: Admit to inpatient   PATIENT DISPOSITION:  PACU - hemodynamically stable.   Delay start of Pharmacological VTE agent (>24hrs) due to surgical blood loss or risk of bleeding: not applicable

## 2013-02-08 NOTE — Anesthesia Postprocedure Evaluation (Signed)
  Anesthesia Post-op Note  Patient: Katherine Ochoa  Procedure(s) Performed: Procedure(s) with comments: REPEAT CESAREAN SECTION (N/A) - Repeat edc 02/15/13  Patient is awake, responsive, moving her legs, and has signs of resolution of her numbness. Pain and nausea are reasonably well controlled. Vital signs are stable and clinically acceptable. Oxygen saturation is clinically acceptable. There are no apparent anesthetic complications at this time. Patient is ready for discharge.

## 2013-02-08 NOTE — Lactation Note (Signed)
This note was copied from the chart of Katherine Ochoa. Lactation Consultation Note Initial visit at 4 hours of age.  Mom reports baby latched well and is now asleep in crib.  Discussed skin to skin and feeding cues.  Mom reports knowing how to hand express and sees visible colostrum.  Baby and me booklet discussed to review feeding frequency.  Mom attempts latch in modified football hold, she is recovering form a c-section.  Baby took a few sucks, but mom wanted to sit up.  Assisted and baby latched well with rhythmic suckling bursts while skin to skin.  FOB at bedside supportive.  Mom to call for assist as needed.    Patient Name: Katherine Hebert Sohoina Shives YNWGN'FToday's Date: 02/08/2013 Reason for consult: Initial assessment   Maternal Data Does the patient have breastfeeding experience prior to this delivery?: Yes  Feeding Feeding Type: Breast Fed Length of feed:  (few minutes)  LATCH Score/Interventions Latch: Repeated attempts needed to sustain latch, nipple held in mouth throughout feeding, stimulation needed to elicit sucking reflex.  Audible Swallowing: None  Type of Nipple: Everted at rest and after stimulation (flatten some with compression)  Comfort (Breast/Nipple): Soft / non-tender     Hold (Positioning): Assistance needed to correctly position infant at breast and maintain latch.  LATCH Score: 6  Lactation Tools Discussed/Used     Consult Status Consult Status: Follow-up Date: 02/09/13 Follow-up type: In-patient    Jannifer RodneyShoptaw, Jermarcus Mcfadyen Lynn 02/08/2013, 6:27 PM

## 2013-02-09 LAB — CBC
HEMATOCRIT: 32.9 % — AB (ref 36.0–46.0)
HEMOGLOBIN: 10.8 g/dL — AB (ref 12.0–15.0)
MCH: 29.6 pg (ref 26.0–34.0)
MCHC: 32.8 g/dL (ref 30.0–36.0)
MCV: 90.1 fL (ref 78.0–100.0)
Platelets: 141 10*3/uL — ABNORMAL LOW (ref 150–400)
RBC: 3.65 MIL/uL — AB (ref 3.87–5.11)
RDW: 14.3 % (ref 11.5–15.5)
WBC: 9.8 10*3/uL (ref 4.0–10.5)

## 2013-02-09 MED ORDER — IBUPROFEN 600 MG PO TABS: 600.0000 mg | ORAL_TABLET | Freq: Four times a day (QID) | ORAL | Status: DC

## 2013-02-09 MED ORDER — OXYCODONE-ACETAMINOPHEN 5-325 MG PO TABS: 1.0000 | ORAL_TABLET | ORAL | Status: DC | PRN

## 2013-02-09 NOTE — Anesthesia Postprocedure Evaluation (Signed)
  Anesthesia Post-op Note  Patient: Katherine Ochoa  Procedure(s) Performed: Procedure(s) with comments: REPEAT CESAREAN SECTION (N/A) - Repeat edc 02/15/13  Patient Location: Mother/Baby  Anesthesia Type:Spinal  Level of Consciousness: awake, alert , oriented and patient cooperative  Airway and Oxygen Therapy: Patient Spontanous Breathing  Post-op Pain: none  Post-op Assessment: Patient's Cardiovascular Status Stable, Respiratory Function Stable, Patent Airway, No signs of Nausea or vomiting, Adequate PO intake, Pain level controlled, No headache, No backache, No residual numbness and No residual motor weakness  Post-op Vital Signs: Reviewed and stable  Complications: No apparent anesthesia complications

## 2013-02-09 NOTE — Progress Notes (Signed)
Subjective: Postpartum Day 1: Cesarean Delivery Patient reports tolerating PO, + flatus and no problems voiding.    Objective: Vital signs in last 24 hours: Temp:  [98 F (36.7 C)-99.9 F (37.7 C)] 98.6 F (37 C) (01/31 0620) Pulse Rate:  [55-80] 80 (01/31 0620) Resp:  [12-20] 18 (01/31 0620) BP: (88-118)/(48-69) 102/65 mmHg (01/31 0620) SpO2:  [95 %-100 %] 96 % (01/31 0620) Weight:  [104.327 kg (230 lb)] 104.327 kg (230 lb) (01/30 1437)  Physical Exam:  General: alert, cooperative and appears stated age Lochia: appropriate Uterine Fundus: firm Incision: healing well, no significant drainage DVT Evaluation: No evidence of DVT seen on physical exam.   Recent Labs  02/07/13 1405 02/09/13 0554  HGB 11.8* 10.8*  HCT 35.8* 32.9*    Assessment/Plan: Status post Cesarean section. Doing well postoperatively.  Continue current care.  Kaymarie Wynn L 02/09/2013, 8:03 AM

## 2013-02-09 NOTE — Discharge Summary (Deleted)
Obstetric Discharge Summary Reason for Admission: cesarean section Prenatal Procedures: none Intrapartum Procedures: cesarean: low cervical, transverse Postpartum Procedures: none Complications-Operative and Postpartum: none Hemoglobin  Date Value Range Status  02/09/2013 10.8* 12.0 - 15.0 g/dL Final     HCT  Date Value Range Status  02/09/2013 32.9* 36.0 - 46.0 % Final    Physical Exam:  General: alert, cooperative and appears stated age 38Lochia: appropriate Uterine Fundus: firm Incision: healing well, no significant drainage, no dehiscence, no significant erythema DVT Evaluation: No evidence of DVT seen on physical exam.  Discharge Diagnoses: Term Pregnancy-delivered  Discharge Information: Date: 02/09/2013 Activity: pelvic rest Diet: routine Medications: Ibuprofen and Percocet Condition: stable Instructions: refer to practice specific booklet Discharge to: home   Newborn Data: Live born female  Birth Weight: 7 lb 9.9 oz (3455 g) APGAR: 9, 9  Home with mother.  Katherine Ochoa L 02/09/2013, 8:07 AM

## 2013-02-09 NOTE — Progress Notes (Deleted)
Subjective: Postpartum Day 2: Cesarean Delivery Patient reports tolerating PO, + flatus and no problems voiding.    Objective: Vital signs in last 24 hours: Temp:  [98 F (36.7 C)-99.9 F (37.7 C)] 98.6 F (37 C) (01/31 0620) Pulse Rate:  [55-80] 80 (01/31 0620) Resp:  [12-20] 18 (01/31 0620) BP: (88-118)/(48-69) 102/65 mmHg (01/31 0620) SpO2:  [95 %-100 %] 96 % (01/31 0620) Weight:  [104.327 kg (230 lb)] 104.327 kg (230 lb) (01/30 1437)  Physical Exam:  General: alert, cooperative and appears stated age Lochia: appropriate Uterine Fundus: firm Incision: healing well, no significant drainage, no dehiscence, no significant erythema DVT Evaluation: No evidence of DVT seen on physical exam.   Recent Labs  02/07/13 1405 02/09/13 0554  HGB 11.8* 10.8*  HCT 35.8* 32.9*    Assessment/Plan: Status post Cesarean section. Doing well postoperatively.  Discharge home with standard precautions and return to clinic on Tuesday.  Shaiann Mcmanamon L 02/09/2013, 8:06 AM

## 2013-02-09 NOTE — Op Note (Signed)
NAMHebert Soho:  Katherine Ochoa, Katherine Ochoa                  ACCOUNT NO.:  [id number]  MEDICAL RECORD NO.:  [id number]  LOCATION:  9124                          FACILITY:  [facility]  PHYSICIAN:  Debbie Bellucci L. Terryl Molinelli, M.D.DATE OF BIRTH:  07-19-1980  DATE OF PROCEDURE:  02/08/2013 DATE OF DISCHARGE:                              OPERATIVE REPORT   PREOPERATIVE DIAGNOSES:  Intrauterine pregnancy at term and previous cesarean section.  POSTOPERATIVE DIAGNOSES:  Intrauterine pregnancy at term and previous cesarean section.  PROCEDURE:  Repeat low-transverse cesarean section.  SURGEON:  Gesselle Fitzsimons L. Vincente PoliGrewal, M.D.  ANESTHESIA:  Spinal.  EBL:  Less than 500 mL.  COMPLICATIONS:  None.  DRAINS:  Foley catheter.  PROCEDURE:  The patient was taken to the operating room.  She had a spinal that was placed.  She was then prepped and draped in usual sterile fashion.  Foley catheter was inserted.  A low-transverse incision was made, the incision was carried down to the fascia.  Fascia was scored in the midline and extended laterally.  The rectus muscles were separated in the midline.  The peritoneal incision was then stretched.  The bladder blade was inserted.  The lower uterine segment was identified and the bladder flap was created sharply and then digitally.  The bladder blade was then readjusted.  A low-transverse incision was made in the uterus with careful attention, the bladder was well below our incision.  The baby was in cephalic presentation, was delivered easily with gentle pull of the vacuum.  The baby was a female infant.  Apgars were 9 at one minute, 9 at five minutes.  The cord was clamped and cut.  The baby was handed to the awaiting neonatal team. The uterus was exteriorized and cleared of all clots and debris.  The uterine incision was closed in 1 layer using 0-chromic in a running locked stitch.  Hemostasis was very good.  Uterus was returned to the abdomen.  Irrigation was performed again.  Hemostasis  was noted again. The peritoneum was closed using 0-Vicryl.  The fascia was closed using 0- Vicryl starting at each corner meeting in the midline.  After irrigation of subcutaneous layer, the skin was closed with 3-0 Vicryl on a Keith needle.  Dermabond was applied.  All sponge, lap, and instrument counts were correct x2.  The patient went to the recovery room in stable condition.     Emillie Chasen L. Vincente PoliGrewal, M.D.     Florestine AversMLG/MEDQ  D:  02/08/2013  T:  02/09/2013  Job:  [id number]

## 2013-02-09 NOTE — Lactation Note (Addendum)
This note was copied from the chart of Katherine Hebert Sohoina Marcano. Lactation Consultation Note  Patient Name: Katherine Ochoa Date: 02/09/2013 Reason for consult: Follow-up assessment  Infant has breastfed x10 in past 24 hrs with LS-9 by RN; voids -6 in 24 hrs/ 7 life; stools- 5 in 24 hrs/ 6 life.  Mom reports infant is breastfeeding well; denies pain.  Educated on cluster feeding and size of infant's stomach given the day of life.  Mom has history of hypothyroidism.  LC to follow-up with mom regarding hypothyroidism and role r/t breastmilk volume.  Encouraged to continue feeding with feeding cues and to call for assistance as needed.    1910 - Called mom's room to talk with her about hypothyroidism and role in milk volume so that mom is aware and informed.  Mom also told LC that she has a history of PCOS.  Informed mom of support group and outpatient services if needed after discharge for feeding assessment and amount of milk transfer assessment if needed.    Consult Status Consult Status: Follow-up Date: 02/10/13 Follow-up type: In-patient    Katherine Ochoa, Katherine Ochoa 02/09/2013, 7:00 PM

## 2013-02-10 MED ORDER — IBUPROFEN 600 MG PO TABS: 600.0000 mg | ORAL_TABLET | Freq: Four times a day (QID) | ORAL | Status: DC

## 2013-02-10 NOTE — Lactation Note (Signed)
This note was copied from the chart of Katherine Hebert Sohoina Raulerson. Lactation Consultation Note Follow up consult:  Baby Katherine 6645 hours old and being discharged.  Baby has not stooled in > 24 hours. Experienced BF mother, breastfed older sibling 17 months.  Mother's breasts are filling, good colostrum expressed.  Denies soreness. Mother breastfeeding in cradle position with shallow latch.  Assisted mother to breastfeed in Collazos cradle position to given more head support to baby and encourage a deeper wider latch.  Mother receptive to teaching.  Reviewed waking techniques, engorgement care and lactation support services.  Parents are making appointment with pediatrician for Tuesday.   Patient Name: Katherine Ochoa WUJWJ'XToday's Date: 02/10/2013 Reason for consult: Follow-up assessment   Maternal Data    Feeding Feeding Type: Breast Fed  LATCH Score/Interventions Latch: Grasps breast easily, tongue down, lips flanged, rhythmical sucking. Intervention(s): Breast massage;Adjust position  Audible Swallowing: A few with stimulation Intervention(s): Hand expression Intervention(s): Alternate breast massage  Type of Nipple: Everted at rest and after stimulation  Comfort (Breast/Nipple): Soft / non-tender     Hold (Positioning): Assistance needed to correctly position infant at breast and maintain latch.  LATCH Score: 8  Lactation Tools Discussed/Used     Consult Status Consult Status: Complete    Hardie PulleyBerkelhammer, Myleka Moncure Boschen 02/10/2013, 11:23 AM

## 2013-02-10 NOTE — Progress Notes (Signed)
Subjective: Postpartum Day2  Cesarean Delivery Patient reports incisional pain and tolerating PO.    Objective: Vital signs in last 24 hours: Temp:  [97.8 F (36.6 C)-98.4 F (36.9 C)] 98.2 F (36.8 C) (02/01 78290632) Pulse Rate:  [67-103] 103 (02/01 0632) Resp:  [18-19] 19 (02/01 0632) BP: (103-109)/(56-69) 104/56 mmHg (02/01 0632) SpO2:  [97 %-98 %] 98 % (02/01 56210632)  Physical Exam:  General: alert, cooperative and appears stated age Lochia: appropriate Uterine Fundus: firm Incision: healing well, no significant drainage, no dehiscence, no significant erythema DVT Evaluation: No evidence of DVT seen on physical exam.   Recent Labs  02/07/13 1405 02/09/13 0554  HGB 11.8* 10.8*  HCT 35.8* 32.9*    Assessment/Plan: Status post Cesarean section. Doing well postoperatively.  Discharge home with standard precautions and return to clinic in 1 week.  Katherine Ochoa L 02/10/2013, 7:57 AM

## 2013-02-10 NOTE — Discharge Summary (Signed)
Obstetric Discharge Summary Reason for Admission: cesarean section Prenatal Procedures: none Intrapartum Procedures: cesarean: low cervical, transverse Postpartum Procedures: none Complications-Operative and Postpartum: none Hemoglobin  Date Value Range Status  02/09/2013 10.8* 12.0 - 15.0 g/dL Final     HCT  Date Value Range Status  02/09/2013 32.9* 36.0 - 46.0 % Final    Physical Exam:  General: alert, cooperative and appears older than stated age Lochia: appropriate Uterine Fundus: firm Incision: healing well, no significant drainage, no dehiscence, no significant erythema DVT Evaluation: No evidence of DVT seen on physical exam.  Discharge Diagnoses: Term Pregnancy-delivered  Discharge Information: Date: 02/10/2013 Activity: pelvic rest Diet: routine Medications: Ibuprofen Condition: improved Instructions: refer to practice specific booklet Discharge to: home   Newborn Data: Live born female  Birth Weight: 7 lb 9.9 oz (3455 g) APGAR: 9, 9  Home with mother.  Ariyannah Pauling L 02/10/2013, 7:57 AM

## 2013-02-11 ENCOUNTER — Encounter (HOSPITAL_COMMUNITY): Payer: Self-pay | Admitting: *Deleted

## 2013-03-26 ENCOUNTER — Other Ambulatory Visit: Payer: Self-pay | Admitting: Obstetrics and Gynecology

## 2013-11-11 ENCOUNTER — Encounter (HOSPITAL_COMMUNITY): Payer: Self-pay | Admitting: *Deleted

## 2013-12-19 ENCOUNTER — Other Ambulatory Visit: Payer: Self-pay

## 2015-10-01 LAB — OB RESULTS CONSOLE GBS: GBS: POSITIVE

## 2015-12-16 ENCOUNTER — Encounter (HOSPITAL_COMMUNITY): Payer: Self-pay | Admitting: *Deleted

## 2015-12-17 ENCOUNTER — Encounter (HOSPITAL_COMMUNITY): Payer: Self-pay

## 2015-12-22 ENCOUNTER — Encounter (HOSPITAL_COMMUNITY)
Admission: AD | Disposition: A | Discharge status: Home / Self Care | Payer: Self-pay | Source: Ambulatory Visit | Attending: Obstetrics and Gynecology

## 2015-12-22 ENCOUNTER — Inpatient Hospital Stay (HOSPITAL_COMMUNITY): Payer: Commercial Managed Care - PPO | Admitting: Anesthesiology

## 2015-12-22 ENCOUNTER — Encounter (HOSPITAL_COMMUNITY): Payer: Self-pay | Admitting: Anesthesiology

## 2015-12-22 ENCOUNTER — Inpatient Hospital Stay (HOSPITAL_COMMUNITY)
Admission: AD | Admit: 2015-12-22 | Discharge: 2015-12-25 | DRG: 765 | Disposition: A | Discharge status: Home / Self Care | Payer: Commercial Managed Care - PPO | Source: Ambulatory Visit | Attending: Obstetrics and Gynecology | Admitting: Obstetrics and Gynecology

## 2015-12-22 DIAGNOSIS — Z833 Family history of diabetes mellitus: Secondary | ICD-10-CM | POA: Diagnosis not present

## 2015-12-22 DIAGNOSIS — Z823 Family history of stroke: Secondary | ICD-10-CM | POA: Diagnosis not present

## 2015-12-22 DIAGNOSIS — Z6837 Body mass index (BMI) 37.0-37.9, adult: Secondary | ICD-10-CM

## 2015-12-22 DIAGNOSIS — O9081 Anemia of the puerperium: Secondary | ICD-10-CM | POA: Diagnosis not present

## 2015-12-22 DIAGNOSIS — Z8249 Family history of ischemic heart disease and other diseases of the circulatory system: Secondary | ICD-10-CM | POA: Diagnosis not present

## 2015-12-22 DIAGNOSIS — E669 Obesity, unspecified: Secondary | ICD-10-CM | POA: Diagnosis present

## 2015-12-22 DIAGNOSIS — O43213 Placenta accreta, third trimester: Secondary | ICD-10-CM | POA: Diagnosis present

## 2015-12-22 DIAGNOSIS — Z3A38 38 weeks gestation of pregnancy: Secondary | ICD-10-CM

## 2015-12-22 DIAGNOSIS — E282 Polycystic ovarian syndrome: Secondary | ICD-10-CM | POA: Diagnosis present

## 2015-12-22 DIAGNOSIS — D62 Acute posthemorrhagic anemia: Secondary | ICD-10-CM

## 2015-12-22 DIAGNOSIS — O34211 Maternal care for low transverse scar from previous cesarean delivery: Secondary | ICD-10-CM | POA: Diagnosis present

## 2015-12-22 DIAGNOSIS — O99214 Obesity complicating childbirth: Secondary | ICD-10-CM | POA: Diagnosis present

## 2015-12-22 DIAGNOSIS — O34219 Maternal care for unspecified type scar from previous cesarean delivery: Secondary | ICD-10-CM

## 2015-12-22 DIAGNOSIS — O99824 Streptococcus B carrier state complicating childbirth: Secondary | ICD-10-CM | POA: Diagnosis present

## 2015-12-22 DIAGNOSIS — Z98891 History of uterine scar from previous surgery: Secondary | ICD-10-CM

## 2015-12-22 HISTORY — PX: CYSTOSCOPY: SHX5120

## 2015-12-22 LAB — TYPE AND SCREEN
ABO/RH(D): O POS
ANTIBODY SCREEN: NEGATIVE

## 2015-12-22 LAB — DIC (DISSEMINATED INTRAVASCULAR COAGULATION)PANEL
D-Dimer, Quant: 3.4 ug/mL-FEU — ABNORMAL HIGH (ref 0.00–0.50)
Fibrinogen: 447 mg/dL (ref 210–475)
Platelets: 208 10*3/uL (ref 150–400)
Smear Review: NONE SEEN
aPTT: 31 seconds (ref 24–36)

## 2015-12-22 LAB — CBC
HCT: 39 % (ref 36.0–46.0)
HCT: 28.4 % — ABNORMAL LOW (ref 36.0–46.0)
HCT: 23.7 % — ABNORMAL LOW (ref 36.0–46.0)
HEMOGLOBIN: 9.5 g/dL — AB (ref 12.0–15.0)
HEMOGLOBIN: 8.3 g/dL — AB (ref 12.0–15.0)
Hemoglobin: 13.3 g/dL (ref 12.0–15.0)
MCH: 30.2 pg (ref 26.0–34.0)
MCH: 30 pg (ref 26.0–34.0)
MCH: 30.9 pg (ref 26.0–34.0)
MCHC: 34.1 g/dL (ref 30.0–36.0)
MCHC: 33.5 g/dL (ref 30.0–36.0)
MCHC: 35 g/dL (ref 30.0–36.0)
MCV: 88.4 fL (ref 78.0–100.0)
MCV: 89.6 fL (ref 78.0–100.0)
MCV: 88.1 fL (ref 78.0–100.0)
PLATELETS: 172 10*3/uL (ref 150–400)
Platelets: 202 10*3/uL (ref 150–400)
Platelets: 208 10*3/uL (ref 150–400)
RBC: 4.41 MIL/uL (ref 3.87–5.11)
RBC: 3.17 MIL/uL — ABNORMAL LOW (ref 3.87–5.11)
RBC: 2.69 MIL/uL — AB (ref 3.87–5.11)
RDW: 13.2 % (ref 11.5–15.5)
RDW: 13.2 % (ref 11.5–15.5)
RDW: 13.3 % (ref 11.5–15.5)
WBC: 19.6 10*3/uL — AB (ref 4.0–10.5)
WBC: 13.3 10*3/uL — AB (ref 4.0–10.5)
WBC: 23.6 10*3/uL — AB (ref 4.0–10.5)

## 2015-12-22 LAB — RPR: RPR Ser Ql: NONREACTIVE

## 2015-12-22 LAB — DIC (DISSEMINATED INTRAVASCULAR COAGULATION) PANEL
INR: 1.1
PROTHROMBIN TIME: 14.2 s (ref 11.4–15.2)

## 2015-12-22 SURGERY — Surgical Case
Anesthesia: Regional | Site: Bladder

## 2015-12-22 MED ORDER — COCONUT OIL OIL
1.0000 "application " | TOPICAL_OIL | Status: DC | PRN
  Filled 2015-12-22: qty 120

## 2015-12-22 MED ORDER — SIMETHICONE 80 MG PO CHEW
80.0000 mg | CHEWABLE_TABLET | Freq: Three times a day (TID) | ORAL | Status: DC
  Administered 2015-12-22 – 2015-12-25 (×8): 80 mg via ORAL
  Filled 2015-12-22 (×14): qty 1

## 2015-12-22 MED ORDER — CEFAZOLIN IN D5W 1 GM/50ML IV SOLN
1.0000 g | Freq: Three times a day (TID) | INTRAVENOUS | Status: DC
  Administered 2015-12-22 – 2015-12-23 (×3): 1 g via INTRAVENOUS
  Filled 2015-12-22 (×5): qty 50

## 2015-12-22 MED ORDER — METHYLENE BLUE 0.5 % INJ SOLN
INTRAVENOUS | Status: DC | PRN
  Administered 2015-12-22: 5 mL via INTRAVENOUS

## 2015-12-22 MED ORDER — METHYLENE BLUE 0.5 % INJ SOLN: INTRAVENOUS | Status: DC | PRN

## 2015-12-22 MED ORDER — KETAMINE HCL 10 MG/ML IJ SOLN
INTRAMUSCULAR | Status: AC
  Filled 2015-12-22: qty 1

## 2015-12-22 MED ORDER — OXYTOCIN 40 UNITS IN LACTATED RINGERS INFUSION - SIMPLE MED: 2.5000 [IU]/h | INTRAVENOUS | Status: AC

## 2015-12-22 MED ORDER — FAMOTIDINE IN NACL 20-0.9 MG/50ML-% IV SOLN
20.0000 mg | Freq: Once | INTRAVENOUS | Status: AC
  Administered 2015-12-22: 20 mg via INTRAVENOUS

## 2015-12-22 MED ORDER — LACTATED RINGERS IV SOLN
INTRAVENOUS | Status: DC | PRN
  Administered 2015-12-22: 40 [IU] via INTRAVENOUS

## 2015-12-22 MED ORDER — MORPHINE SULFATE (PF) 0.5 MG/ML IJ SOLN
INTRAMUSCULAR | Status: DC | PRN
  Administered 2015-12-22: .2 ug via INTRATHECAL

## 2015-12-22 MED ORDER — DIPHENHYDRAMINE HCL 25 MG PO CAPS
25.0000 mg | ORAL_CAPSULE | Freq: Four times a day (QID) | ORAL | Status: DC | PRN
  Filled 2015-12-22: qty 1

## 2015-12-22 MED ORDER — MIDAZOLAM HCL 2 MG/2ML IJ SOLN
INTRAMUSCULAR | Status: AC
  Filled 2015-12-22: qty 2

## 2015-12-22 MED ORDER — NALOXONE HCL 0.4 MG/ML IJ SOLN: 0.4000 mg | INTRAMUSCULAR | Status: DC | PRN

## 2015-12-22 MED ORDER — ONDANSETRON HCL 4 MG/2ML IJ SOLN
INTRAMUSCULAR | Status: DC | PRN
  Administered 2015-12-22: 4 mg via INTRAVENOUS

## 2015-12-22 MED ORDER — NALOXONE HCL 2 MG/2ML IJ SOSY
1.0000 ug/kg/h | PREFILLED_SYRINGE | INTRAMUSCULAR | Status: DC | PRN
Start: 2015-12-22 — End: 2015-12-25
  Filled 2015-12-22: qty 2

## 2015-12-22 MED ORDER — CEFAZOLIN SODIUM-DEXTROSE 2-3 GM-% IV SOLR
INTRAVENOUS | Status: DC | PRN
  Administered 2015-12-22: 2 g via INTRAVENOUS

## 2015-12-22 MED ORDER — CEFAZOLIN IN D5W 1 GM/50ML IV SOLN
1.0000 g | Freq: Two times a day (BID) | INTRAVENOUS | Status: DC
  Filled 2015-12-22 (×2): qty 50

## 2015-12-22 MED ORDER — LACTATED RINGERS IV BOLUS (SEPSIS)
1000.0000 mL | Freq: Once | INTRAVENOUS | Status: AC
  Administered 2015-12-22: 1000 mL via INTRAVENOUS

## 2015-12-22 MED ORDER — LACTATED RINGERS IV SOLN
INTRAVENOUS | Status: DC
  Administered 2015-12-22 – 2015-12-23 (×4): via INTRAVENOUS

## 2015-12-22 MED ORDER — TETANUS-DIPHTH-ACELL PERTUSSIS 5-2.5-18.5 LF-MCG/0.5 IM SUSP
0.5000 mL | Freq: Once | INTRAMUSCULAR | Status: DC
  Filled 2015-12-22: qty 0.5

## 2015-12-22 MED ORDER — SIMETHICONE 80 MG PO CHEW
80.0000 mg | CHEWABLE_TABLET | ORAL | Status: DC
  Administered 2015-12-22 – 2015-12-24 (×3): 80 mg via ORAL
  Filled 2015-12-22 (×5): qty 1

## 2015-12-22 MED ORDER — SOD CITRATE-CITRIC ACID 500-334 MG/5ML PO SOLN
ORAL | Status: AC
  Filled 2015-12-22: qty 15

## 2015-12-22 MED ORDER — DIBUCAINE 1 % RE OINT
1.0000 "application " | TOPICAL_OINTMENT | RECTAL | Status: DC | PRN
  Filled 2015-12-22: qty 28

## 2015-12-22 MED ORDER — NALBUPHINE HCL 10 MG/ML IJ SOLN: 5.0000 mg | Freq: Once | INTRAMUSCULAR | Status: DC | PRN

## 2015-12-22 MED ORDER — ZOLPIDEM TARTRATE 5 MG PO TABS: 5.0000 mg | ORAL_TABLET | Freq: Every evening | ORAL | Status: DC | PRN

## 2015-12-22 MED ORDER — WITCH HAZEL-GLYCERIN EX PADS: 1.0000 "application " | MEDICATED_PAD | CUTANEOUS | Status: DC | PRN

## 2015-12-22 MED ORDER — HETASTARCH-ELECTROLYTES 6 % IV SOLN
INTRAVENOUS | Status: DC | PRN
Start: 2015-12-22 — End: 2015-12-22
  Administered 2015-12-22: 03:00:00 via INTRAVENOUS

## 2015-12-22 MED ORDER — CEFAZOLIN SODIUM-DEXTROSE 2-4 GM/100ML-% IV SOLN
INTRAVENOUS | Status: AC
  Filled 2015-12-22: qty 100

## 2015-12-22 MED ORDER — EPHEDRINE SULFATE 50 MG/ML IJ SOLN
INTRAMUSCULAR | Status: DC | PRN
  Administered 2015-12-22: 10 mg via INTRAVENOUS

## 2015-12-22 MED ORDER — DIPHENHYDRAMINE HCL 25 MG PO CAPS
25.0000 mg | ORAL_CAPSULE | ORAL | Status: DC | PRN
  Filled 2015-12-22: qty 1

## 2015-12-22 MED ORDER — ONDANSETRON HCL 4 MG/2ML IJ SOLN
INTRAMUSCULAR | Status: AC
  Filled 2015-12-22: qty 2

## 2015-12-22 MED ORDER — KETOROLAC TROMETHAMINE 30 MG/ML IJ SOLN: 30.0000 mg | Freq: Four times a day (QID) | INTRAMUSCULAR | Status: AC | PRN

## 2015-12-22 MED ORDER — PROMETHAZINE HCL 25 MG/ML IJ SOLN: 6.2500 mg | INTRAMUSCULAR | Status: DC | PRN

## 2015-12-22 MED ORDER — MEPERIDINE HCL 25 MG/ML IJ SOLN: 6.2500 mg | INTRAMUSCULAR | Status: DC | PRN

## 2015-12-22 MED ORDER — IBUPROFEN 600 MG PO TABS
600.0000 mg | ORAL_TABLET | Freq: Four times a day (QID) | ORAL | Status: DC
  Administered 2015-12-22 – 2015-12-25 (×13): 600 mg via ORAL
  Filled 2015-12-22 (×13): qty 1

## 2015-12-22 MED ORDER — LACTATED RINGERS IV SOLN
INTRAVENOUS | Status: DC
  Administered 2015-12-22 (×4): via INTRAVENOUS

## 2015-12-22 MED ORDER — LACTATED RINGERS IV SOLN
INTRAVENOUS | Status: DC | PRN
  Administered 2015-12-22 (×3): via INTRAVENOUS

## 2015-12-22 MED ORDER — DIPHENHYDRAMINE HCL 50 MG/ML IJ SOLN: 12.5000 mg | INTRAMUSCULAR | Status: DC | PRN

## 2015-12-22 MED ORDER — OXYCODONE HCL 5 MG PO TABS: 5.0000 mg | ORAL_TABLET | ORAL | Status: DC | PRN

## 2015-12-22 MED ORDER — SCOPOLAMINE 1 MG/3DAYS TD PT72
MEDICATED_PATCH | TRANSDERMAL | Status: DC | PRN
  Administered 2015-12-22: 1 via TRANSDERMAL

## 2015-12-22 MED ORDER — DEXAMETHASONE SODIUM PHOSPHATE 10 MG/ML IJ SOLN
INTRAMUSCULAR | Status: DC | PRN
  Administered 2015-12-22: 10 mg via INTRAVENOUS

## 2015-12-22 MED ORDER — CHLOROPROCAINE HCL (PF) 3 % IJ SOLN
INTRAMUSCULAR | Status: AC
  Filled 2015-12-22: qty 20

## 2015-12-22 MED ORDER — FENTANYL CITRATE (PF) 100 MCG/2ML IJ SOLN
INTRAMUSCULAR | Status: DC | PRN
Start: 2015-12-22 — End: 2015-12-22
  Administered 2015-12-22 (×3): 50 ug via INTRAVENOUS
  Administered 2015-12-22: 10 ug via INTRATHECAL
  Administered 2015-12-22: 50 ug via INTRAVENOUS
  Administered 2015-12-22: 90 ug via INTRAVENOUS
  Administered 2015-12-22: 50 ug via INTRAVENOUS

## 2015-12-22 MED ORDER — FAMOTIDINE IN NACL 20-0.9 MG/50ML-% IV SOLN
INTRAVENOUS | Status: AC
  Filled 2015-12-22: qty 50

## 2015-12-22 MED ORDER — SODIUM CHLORIDE 0.9% FLUSH: 3.0000 mL | INTRAVENOUS | Status: DC | PRN

## 2015-12-22 MED ORDER — ONDANSETRON HCL 4 MG/2ML IJ SOLN: 4.0000 mg | Freq: Three times a day (TID) | INTRAMUSCULAR | Status: DC | PRN

## 2015-12-22 MED ORDER — SCOPOLAMINE 1 MG/3DAYS TD PT72: 1.0000 | MEDICATED_PATCH | Freq: Once | TRANSDERMAL | Status: DC

## 2015-12-22 MED ORDER — SOD CITRATE-CITRIC ACID 500-334 MG/5ML PO SOLN
30.0000 mL | Freq: Once | ORAL | Status: AC
  Administered 2015-12-22: 30 mL via ORAL

## 2015-12-22 MED ORDER — MENTHOL 3 MG MT LOZG
1.0000 | LOZENGE | OROMUCOSAL | Status: DC | PRN
  Filled 2015-12-22: qty 9

## 2015-12-22 MED ORDER — PRENATAL MULTIVITAMIN CH
1.0000 | ORAL_TABLET | Freq: Every day | ORAL | Status: DC
  Administered 2015-12-22 – 2015-12-24 (×3): 1 via ORAL
  Filled 2015-12-22 (×8): qty 1

## 2015-12-22 MED ORDER — PHENYLEPHRINE 40 MCG/ML (10ML) SYRINGE FOR IV PUSH (FOR BLOOD PRESSURE SUPPORT)
PREFILLED_SYRINGE | INTRAVENOUS | Status: AC
  Filled 2015-12-22: qty 10

## 2015-12-22 MED ORDER — NALBUPHINE HCL 10 MG/ML IJ SOLN: 5.0000 mg | INTRAMUSCULAR | Status: DC | PRN

## 2015-12-22 MED ORDER — ACETAMINOPHEN 325 MG PO TABS
650.0000 mg | ORAL_TABLET | ORAL | Status: DC | PRN
  Filled 2015-12-22: qty 2

## 2015-12-22 MED ORDER — OXYTOCIN 10 UNIT/ML IJ SOLN
INTRAMUSCULAR | Status: AC
  Filled 2015-12-22: qty 3

## 2015-12-22 MED ORDER — MORPHINE SULFATE-NACL 0.5-0.9 MG/ML-% IV SOSY
PREFILLED_SYRINGE | INTRAVENOUS | Status: DC | PRN
  Administered 2015-12-22: .3 mg via EPIDURAL

## 2015-12-22 MED ORDER — KETAMINE HCL 10 MG/ML IJ SOLN
INTRAMUSCULAR | Status: DC | PRN
  Administered 2015-12-22 (×2): 20 mg via INTRAVENOUS
  Administered 2015-12-22: 10 mg via INTRAVENOUS
  Administered 2015-12-22: 50 mg via INTRAVENOUS

## 2015-12-22 MED ORDER — CHLOROPROCAINE HCL (PF) 3 % IJ SOLN
INTRAMUSCULAR | Status: DC | PRN
  Administered 2015-12-22: 20 mL

## 2015-12-22 MED ORDER — HYDROMORPHONE HCL 1 MG/ML IJ SOLN: 0.2500 mg | INTRAMUSCULAR | Status: DC | PRN

## 2015-12-22 MED ORDER — PHENYLEPHRINE 40 MCG/ML (10ML) SYRINGE FOR IV PUSH (FOR BLOOD PRESSURE SUPPORT)
PREFILLED_SYRINGE | INTRAVENOUS | Status: DC | PRN
  Administered 2015-12-22 (×3): 40 ug via INTRAVENOUS
  Administered 2015-12-22 (×3): 80 ug via INTRAVENOUS
  Administered 2015-12-22: 40 ug via INTRAVENOUS
  Administered 2015-12-22: 80 ug via INTRAVENOUS

## 2015-12-22 MED ORDER — METHYLENE BLUE 0.5 % INJ SOLN
INTRAVENOUS | Status: AC
  Filled 2015-12-22: qty 10

## 2015-12-22 MED ORDER — SIMETHICONE 80 MG PO CHEW
80.0000 mg | CHEWABLE_TABLET | ORAL | Status: DC | PRN
  Filled 2015-12-22: qty 1

## 2015-12-22 MED ORDER — FENTANYL CITRATE (PF) 250 MCG/5ML IJ SOLN
INTRAMUSCULAR | Status: AC
  Filled 2015-12-22: qty 5

## 2015-12-22 MED ORDER — MIDAZOLAM HCL 2 MG/2ML IJ SOLN
INTRAMUSCULAR | Status: DC | PRN
  Administered 2015-12-22: 2 mg via INTRAVENOUS

## 2015-12-22 MED ORDER — BUPIVACAINE IN DEXTROSE 0.75-8.25 % IT SOLN
INTRATHECAL | Status: DC | PRN
  Administered 2015-12-22: 12 mg via INTRATHECAL

## 2015-12-22 MED ORDER — SENNOSIDES-DOCUSATE SODIUM 8.6-50 MG PO TABS
2.0000 | ORAL_TABLET | ORAL | Status: DC
  Administered 2015-12-22 – 2015-12-24 (×3): 2 via ORAL
  Filled 2015-12-22 (×5): qty 2

## 2015-12-22 MED ORDER — PHENYLEPHRINE 8 MG IN D5W 100 ML (0.08MG/ML) PREMIX OPTIME
INJECTION | INTRAVENOUS | Status: DC | PRN
  Administered 2015-12-22: 60 ug/min via INTRAVENOUS

## 2015-12-22 MED ORDER — DEXAMETHASONE SODIUM PHOSPHATE 10 MG/ML IJ SOLN
INTRAMUSCULAR | Status: AC
  Filled 2015-12-22: qty 1

## 2015-12-22 MED ORDER — PHENYLEPHRINE 8 MG IN D5W 100 ML (0.08MG/ML) PREMIX OPTIME
INJECTION | INTRAVENOUS | Status: AC
  Filled 2015-12-22: qty 200

## 2015-12-22 MED ORDER — OXYCODONE HCL 5 MG PO TABS: 10.0000 mg | ORAL_TABLET | ORAL | Status: DC | PRN

## 2015-12-22 MED ORDER — SCOPOLAMINE 1 MG/3DAYS TD PT72
MEDICATED_PATCH | TRANSDERMAL | Status: AC
  Filled 2015-12-22: qty 1

## 2015-12-22 MED ORDER — FERUMOXYTOL INJECTION 510 MG/17 ML
510.0000 mg | Freq: Once | INTRAVENOUS | Status: AC
  Administered 2015-12-22: 510 mg via INTRAVENOUS
  Filled 2015-12-22: qty 17

## 2015-12-22 SURGICAL SUPPLY — 40 items
BENZOIN TINCTURE PRP APPL 2/3 (GAUZE/BANDAGES/DRESSINGS) ×4 IMPLANT
CHLORAPREP W/TINT 26ML (MISCELLANEOUS) ×4 IMPLANT
CLAMP CORD UMBIL (MISCELLANEOUS) ×4 IMPLANT
CLOSURE WOUND 1/2 X4 (GAUZE/BANDAGES/DRESSINGS) ×1
CLOTH BEACON ORANGE TIMEOUT ST (SAFETY) ×4 IMPLANT
DERMABOND ADVANCED (GAUZE/BANDAGES/DRESSINGS) ×2
DERMABOND ADVANCED .7 DNX12 (GAUZE/BANDAGES/DRESSINGS) ×2 IMPLANT
DRSG OPSITE POSTOP 4X10 (GAUZE/BANDAGES/DRESSINGS) ×4 IMPLANT
ELECT REM PT RETURN 9FT ADLT (ELECTROSURGICAL) ×4
ELECTRODE REM PT RTRN 9FT ADLT (ELECTROSURGICAL) ×2 IMPLANT
EXTRACTOR VACUUM KIWI (MISCELLANEOUS) IMPLANT
GLOVE BIO SURGEON STRL SZ 6.5 (GLOVE) ×3 IMPLANT
GLOVE BIO SURGEONS STRL SZ 6.5 (GLOVE) ×1
GLOVE BIOGEL PI IND STRL 6.5 (GLOVE) ×2 IMPLANT
GLOVE BIOGEL PI IND STRL 7.0 (GLOVE) ×4 IMPLANT
GLOVE BIOGEL PI INDICATOR 6.5 (GLOVE) ×2
GLOVE BIOGEL PI INDICATOR 7.0 (GLOVE) ×4
GOWN STRL REUS W/TWL LRG LVL3 (GOWN DISPOSABLE) ×8 IMPLANT
HEMOSTAT ARISTA ABSORB 3G PWDR (MISCELLANEOUS) ×4 IMPLANT
KIT ABG SYR 3ML LUER SLIP (SYRINGE) ×4 IMPLANT
NEEDLE HYPO 25X5/8 SAFETYGLIDE (NEEDLE) ×4 IMPLANT
NS IRRIG 1000ML POUR BTL (IV SOLUTION) ×4 IMPLANT
PACK C SECTION WH (CUSTOM PROCEDURE TRAY) ×4 IMPLANT
PAD OB MATERNITY 4.3X12.25 (PERSONAL CARE ITEMS) ×4 IMPLANT
PENCIL SMOKE EVAC W/HOLSTER (ELECTROSURGICAL) ×4 IMPLANT
RETRACTOR WND ALEXIS 25 LRG (MISCELLANEOUS) IMPLANT
RTRCTR WOUND ALEXIS 25CM LRG (MISCELLANEOUS)
SET CYSTO W/LG BORE CLAMP LF (SET/KITS/TRAYS/PACK) ×4 IMPLANT
SPONGE GAUZE 4X4 12PLY STER LF (GAUZE/BANDAGES/DRESSINGS) ×4 IMPLANT
SPONGE SURGIFOAM ABS GEL 12-7 (HEMOSTASIS) ×4 IMPLANT
STRIP CLOSURE SKIN 1/2X4 (GAUZE/BANDAGES/DRESSINGS) ×3 IMPLANT
SUT PLAIN 0 NONE (SUTURE) IMPLANT
SUT PLAIN 2 0 (SUTURE) ×2
SUT PLAIN ABS 2-0 CT1 27XMFL (SUTURE) ×2 IMPLANT
SUT VIC AB 0 CT1 36 (SUTURE) ×4 IMPLANT
SUT VIC AB 0 CTX 36 (SUTURE) ×4
SUT VIC AB 0 CTX36XBRD ANBCTRL (SUTURE) ×4 IMPLANT
SUT VIC AB 4-0 KS 27 (SUTURE) ×4 IMPLANT
TOWEL OR 17X24 6PK STRL BLUE (TOWEL DISPOSABLE) ×4 IMPLANT
TRAY FOLEY CATH SILVER 14FR (SET/KITS/TRAYS/PACK) ×4 IMPLANT

## 2015-12-22 NOTE — Progress Notes (Signed)
Called back regarding pt cervix being 9cm. Requested that on call Dr. Melene MullerStart c/s and Dr. Elon SpannerLeger will meet them in the OR

## 2015-12-22 NOTE — Progress Notes (Signed)
Called back to notify that patient is pushing and request MD to bedside in MAU. CNM at bedside with patient.

## 2015-12-22 NOTE — MAU Note (Signed)
Pt presents complaining of contractions since 1900. Denies leaking. Reports some bloody show. 2cm in office today. Repeat c/s scheduled for Thursday.

## 2015-12-22 NOTE — Anesthesia Postprocedure Evaluation (Addendum)
Anesthesia Post Note  Patient: Katherine Ochoa  Procedure(s) Performed: Procedure(s) (LRB): CESAREAN SECTION (N/A)  Patient location during evaluation: Mother Baby Anesthesia Type: Spinal and General Level of consciousness: awake Pain management: pain level controlled Vital Signs Assessment: post-procedure vital signs reviewed and stable Respiratory status: spontaneous breathing Cardiovascular status: stable Postop Assessment: no headache, no backache, patient able to bend at knees and spinal receding Anesthetic complications: no     Last Vitals:  Vitals:   12/22/15 0855 12/22/15 1000  BP: (!) 94/54 (!) 96/52  Pulse: 84 82  Resp: 20 20  Temp:  37.4 C    Last Pain:  Vitals:   12/22/15 1000  TempSrc:   PainSc: 2    Pain Goal: Patients Stated Pain Goal: 1 (12/22/15 0855)               Edison PaceWILKERSON,Kandon Hosking

## 2015-12-22 NOTE — MAU Note (Signed)
Pt presented in active labor

## 2015-12-22 NOTE — Lactation Note (Signed)
This note was copied from a baby's chart. Lactation Consultation Note Experienced BF mother reports that BF is going well. Reminded mom to align baby Belly to breast to aid in a deeper latch. Baby is observed suckling and swallowing. Mom reminded of breastfeeding basics and hand expression. Follow-up tomorrow or sooner if needed.  Patient Name: Girl Katherine Sohoina Kishi WUJWJ'XToday's Date: 12/22/2015 Reason for consult: Initial assessment   Maternal Data Has patient been taught Hand Expression?: Yes Does the patient have breastfeeding experience prior to this delivery?: Yes  Feeding Feeding Type: Breast Fed Length of feed: 18 min  LATCH Score/Interventions Latch: Grasps breast easily, tongue down, lips flanged, rhythmical sucking.  Audible Swallowing: A few with stimulation  Type of Nipple: Everted at rest and after stimulation  Comfort (Breast/Nipple): Soft / non-tender     Hold (Positioning): Assistance needed to correctly position infant at breast and maintain latch.  LATCH Score: 8  Lactation Tools Discussed/Used     Consult Status      Katherine DryerJoseph, Katherine Ochoa 12/22/2015, 4:40 PM

## 2015-12-22 NOTE — Addendum Note (Signed)
Addendum  created 12/22/15 1037 by Earmon PhoenixValerie P Tandy Grawe, CRNA   Sign clinical note

## 2015-12-22 NOTE — MAU Provider Note (Signed)
S: Katherine Ochoa is a [age] y.o. year old [id number] female at 6953w1d weeks gestation who presents to MAU reporting Labor. 6 cm upon arrival, but progressed quickly to completely dilated. [facility]. SROM in MAU clear fluid. Scant bloody show. Pt has Hx C/S x 2. No vaginal deliveries. First C/S was for arrest of descent--fully dilated/0 station, OP, 7 lb 9 oz baby. Second C/S was planned repeat. Planned repeat w/ this baby. GBS unknown. Hx obtained primarily from husband. Prenatal record not available in EPIC.   O: Upon arrival to room cervix was 10/100/+2, vtx. FHR reassuring. Occasional variables w/ good recovery, mod variability, accels.   Dr. Elon SpannerLeger en route. Dr. Adrian BlackwaterStinson called to possibly start C/S, but then notified of cervical exam and that Dr. Elon SpannerLeger was walking into the hospital.   Assessment: 1. Labor: Second stage 2. Fetal Wellbeing: Category I-II  3. Pain Control: None 4. GBS: Unknown 5. 39.1 week IUP 6. Previous C/S x 2  Plan:  [facility]. Assumed care of pt.   Dorathy KinsmanVirginia Tanara Turvey, CNM 12/22/2015 1:40 AM

## 2015-12-22 NOTE — Anesthesia Postprocedure Evaluation (Signed)
Anesthesia Post Note  Patient: Katherine Ochoa  Procedure(s) Performed: Procedure(s) (LRB): CESAREAN SECTION (N/A)  Patient location during evaluation: PACU Anesthesia Type: Spinal and MAC Level of consciousness: awake and alert Pain management: pain level controlled Vital Signs Assessment: post-procedure vital signs reviewed and stable Respiratory status: spontaneous breathing and respiratory function stable Cardiovascular status: blood pressure returned to baseline and stable Postop Assessment: spinal receding Anesthetic complications: no     Last Vitals:  Vitals:   12/22/15 0455 12/22/15 0500  BP:    Pulse: 98   Resp: (!) 21   Temp:  37.1 C    Last Pain:  Vitals:   12/22/15 0451  TempSrc: Oral  PainSc:    Pain Goal:                 [name] DANIEL

## 2015-12-22 NOTE — Progress Notes (Signed)
Subjective: Postpartum Day 0: Cesarean Delivery Patient reports tolerating PO.    Objective: Vital signs in last 24 hours: Temp:  [98.7 F (37.1 C)-99.9 F (37.7 C)] 98.7 F (37.1 C) (12/12 0730) Pulse Rate:  [79-105] 85 (12/12 0730) Resp:  [11-24] 18 (12/12 0730) BP: (92-111)/(56-72) 94/56 (12/12 0730) SpO2:  [96 %-100 %] 100 % (12/12 0730)  Physical Exam:  General: alert and cooperative Lochia: appropriate Uterine Fundus: firm Incision: abd pressure dressing CDI DVT Evaluation: No evidence of DVT seen on physical exam. Negative Homan's sign. No cords or calf tenderness. No significant calf/ankle edema. Foley noted with green stained urine  Recent Labs  12/22/15 0115 12/22/15 0304  HGB 13.3 9.5*  HCT 39.0 28.4*    Assessment/Plan: Status post Cesarean section. Doing well postoperatively.  Continue current care.  Katherine Ochoa 12/22/2015, 8:09 AM

## 2015-12-22 NOTE — Op Note (Addendum)
PROCEDURE DATE: 12/22/2015  PREOPERATIVE DIAGNOSIS: Intrauterine pregnancy at 38.5 wga, Indication: rCS  POSTOPERATIVE DIAGNOSIS:The same  PROCEDURE: rLow TransverseCesarean Section, cystoscopy  SURGEON: Dr. Belva AgeeElise Sullivan Ochoa Assistant: Dr. Candelaria CelesteJacob Ochoa  INDICATIONS:This is a 62AV [id number] at 6038.5 wga requiring cesarean section secondary to rCS. Pt presented in active labor, +2. Pushing involuntarily without any descent. Decision made to proceed with rLTCSx2.The risks of cesarean section discussed with the patient included but were not limited to: bleeding which may require transfusion or reoperation; infection which may require antibiotics; injury to bowel, bladder, ureters or other surrounding organs; injury to the fetus; need for additional procedures including hysterectomy in the event of a life-threatening hemorrhage; placental abnormalities wth subsequent pregnancies, incisional problems, thromboembolic phenomenon and other postoperative/anesthesia complications. The patient agreed with the proposed plan, giving informed consent for the procedure. It was confirmed the patient was a Jehovah's Witness and would rather die than accept blood products.   FINDINGS: Viable femaleinfant in vertex presentation,APGARs pending, weight 6#15 Katherine Ochoa("Katherine Ochoa"), Amniotic fluid clear, Placenta with very difficult delivery requiring manual extraction, suspect focal accreta, three vessel cord. Grossly normal uterus, ovaries and fallopian tubes. Dense scar tissue upon entry of abdomen .  ANESTHESIA: Epidural ESTIMATED BLOOD LOSS: 2100cc SPECIMENS: Placenta for pathology, suspect focal accreta COMPLICATIONS: PPH, suspect placenta accreta  PROCEDURE IN DETAIL: The patient received intravenous antibiotics (2g Ancef) and had sequential compression devices applied to her lower extremities while in the preoperative area. Shewasthen taken to the operating roomwhere spinal anesthesiawas dosed up to  surgical level andwas found to be adequate. She was then placed in a dorsal supine position with a leftward tilt,and prepped and draped in a sterile manner.A foley catheter was placed into her bladder and attached to constant gravity. After an adequate timeout was performed, aPfannenstiel skin incision was made with scalpel and carried through to the underlying layer of fascia. Dense scar tissue was noted. The fascia was incised in the midline and this incision was extended bilaterally using the Mayo scissors. Kocher clamps were applied to the superior aspect of the fascial incision and the underlying rectus muscles were dissected off bluntly. Again, dense scar tissue was noted - muscles adhered to peritoneum and uterus and were taken down with a combination of blunt and sharp dissection. Once the rectus muscles were separated in the midline bluntly, the peritoneum was entered. A bladder flap was created sharply and developed bluntly.Atransverse hysterotomy was made with a scalpel and extended bilaterally bluntly. The bladder blade was then removed. The infant was successfully delivered, and cord was clamped and cut and infant was handed over to awaiting neonatology team. Uterine massage was then administered. The placenta was delivered with some difficulty. Th placenta appeared to be adherent and required manual extraction with multiple attempts. Ultimately, all portions were delivered. Cord gases were taken. The uterus was cleared of clot and debris. The hysterotomy was closed with 0 vicryl.  B/l cervical extensions were noted which were repaired in running locked stitches.The R cervical extension was noted to have additional bleeding and this was again reinforced with a running stitch. An Antony SalmonO'Leary was placed on the R uterine artery that assisted with hemostasis. Ultimately good hemostasis was achieved. The bladder was run and noted to be intact.Given the extent of cervical extensions, cystoscopy was  performed and resulted in a normal bladder survey with b/l brisk ureteral jets were noted. Bladder was noted to be intact and without leakage. At this time, a vaginal exam was performed and cervix intact. Attention turned  to above, re-scrubbed, Arista and gel foam were placed and hemostasis was achieved.  The fascia was closed with 0-Vicryl in a running fashion with good restoration of anatomy. The subcutaneus tissue was irrigated and was reapproximated using three interrupted plain gut stitches. The skin was closed with 4-0 Vicryl in a subcuticular fashion.  Intraop labs notable for hgb of 9.5. Plts, coags, and fibrinogen normal.   Final EBL was 2100cc (all surgical site and was hemostatic at end of procedure) without any further bleeding on exam.   Pt tolerated the procedure well. All sponge/lap/needle counts were correct X 2. Pt taken to recovery room in stable condition. Plan for 24 hours of Ancef q 12 hours given large EBL and need for cystocopy.    Katherine AgeeElise Mikayla Chiusano MD

## 2015-12-22 NOTE — Progress Notes (Signed)
Notified of pt arrival in MAU and exam of 6cm with 2 previous c/s. Will prepare for OR. Dr. Elon SpannerLeger on the way to MAU

## 2015-12-22 NOTE — Anesthesia Procedure Notes (Signed)
Spinal  Patient location during procedure: OR Start time: 12/22/2015 1:43 AM End time: 12/22/2015 1:53 AM Staffing Anesthesiologist: Heather RobertsSINGER, Allena Pietila Performed: anesthesiologist  Preanesthetic Checklist Completed: patient identified, surgical consent, pre-op evaluation, timeout performed, IV checked, risks and benefits discussed and monitors and equipment checked Spinal Block Patient position: sitting Prep: DuraPrep Patient monitoring: cardiac monitor, continuous pulse ox and blood pressure Approach: midline Location: L2-3 Injection technique: single-shot Needle Needle type: Pencan  Needle gauge: 24 G Needle length: 9 cm Additional Notes Functioning IV was confirmed and monitors were applied. Sterile prep and drape, including hand hygiene and sterile gloves were used. The patient was positioned and the spine was prepped. The skin was anesthetized with lidocaine.  Free flow of clear CSF was obtained prior to injecting local anesthetic into the CSF.  The spinal needle aspirated freely following injection.  The needle was carefully withdrawn.  The patient tolerated the procedure well.

## 2015-12-22 NOTE — Addendum Note (Signed)
Addendum  created 12/22/15 1041 by Earmon PhoenixValerie P Tehran Rabenold, CRNA   Sign clinical note

## 2015-12-22 NOTE — MAU Note (Signed)
Ivonne AndrewV. Smith CNM to bedside for delivery.

## 2015-12-22 NOTE — Anesthesia Preprocedure Evaluation (Signed)
Anesthesia Evaluation  Patient identified by MRN, date of birth, ID band Patient awake    Reviewed: Allergy & Precautions, H&P , NPO status , Patient's Chart, lab work & pertinent test results  History of Anesthesia Complications Negative for: history of anesthetic complications  Airway Mallampati: II  TM Distance: >3 FB Neck ROM: Full    Dental no notable dental hx. (+) Teeth Intact, Dental Advisory Given   Pulmonary neg pulmonary ROS,    Pulmonary exam normal breath sounds clear to auscultation       Cardiovascular negative cardio ROS   Rhythm:regular Rate:Normal     Neuro/Psych negative neurological ROS  negative psych ROS   GI/Hepatic negative GI ROS, Neg liver ROS,   Endo/Other  Hypothyroidism (no meds) Obese - BMI 37.2  Renal/GU negative Renal ROS     Musculoskeletal   Abdominal   Peds  Hematology  (+) REFUSES BLOOD PRODUCTS, JEHOVAH'S WITNESS  Anesthesia Other Findings   Reproductive/Obstetrics (+) Pregnancy (h/o prior c/s, for repeat)                             Anesthesia Physical  Anesthesia Plan  ASA: II and emergent  Anesthesia Plan: Spinal   Post-op Pain Management:    Induction:   Airway Management Planned:   Additional Equipment:   Intra-op Plan:   Post-operative Plan:   Informed Consent: I have reviewed the patients History and Physical, chart, labs and discussed the procedure including the risks, benefits and alternatives for the proposed anesthesia with the patient or authorized representative who has indicated his/her understanding and acceptance.   Dental advisory given  Plan Discussed with: Surgeon, CRNA and Anesthesiologist  Anesthesia Plan Comments:         Anesthesia Quick Evaluation

## 2015-12-22 NOTE — H&P (Signed)
Katherine Ochoa is a 35 y.o. female presenting in active labor. +2, pushing involuntarily without any descent. To OR for rLTCS.  OB History    Gravida Para Term Preterm AB Living   4 3 1   1 3    SAB TAB Ectopic Multiple Live Births   1     0 3     Past Medical History:  Diagnosis Date  . Hx of varicella   . Hypothyroidism    stable at present-no meds  . PCOS (polycystic ovarian syndrome)    Past Surgical History:  Procedure Laterality Date  . CESAREAN SECTION  08/08/2010   Procedure: CESAREAN SECTION;  Surgeon: Zelphia CairoGretchen Adkins;  Location: [facility];  Service: Gynecology;  Laterality: N/A;  primary of baby boy at 2105  . CESAREAN SECTION N/A 02/08/2013   Procedure: REPEAT CESAREAN SECTION;  Surgeon: Jeani HawkingMichelle L Grewal, MD;  Location: [facility];  Service: Obstetrics;  Laterality: N/A;  Repeat edc 02/15/13  . WISDOM TOOTH EXTRACTION     Family History: family history includes Diabetes in her father; Heart disease in her father; Mental illness in her father; Stroke in her father; Thyroid disease in her maternal aunt and mother. Social History:  reports that she has never smoked. She has never used smokeless tobacco. She reports that she does not drink alcohol or use drugs.     Maternal Diabetes: No Genetic Screening: Normal Maternal Ultrasounds/Referrals: Normal Fetal Ultrasounds or other Referrals:  None Maternal Substance Abuse:  No Significant Maternal Medications:  None Significant Maternal Lab Results:  None Other Comments:  None  ROS History Dilation: 10 Effacement (%): 100 Station: +2 Exam by:: Katherine Ochoa, CNM Last menstrual period 03/26/2015, unknown if currently breastfeeding. Exam Physical Exam  Painfully contracting, gravid abdomen w/out pain in between ctxn. Old scar non tender  Prenatal labs: ABO, Rh: --/--/O POS (12/12 0115) Antibody: NEG (12/12 0115) Rubella:   RPR:    HBsAg:    HIV:    GBS: Positive (09/21 0000)   Assessment/Plan: 35yo for rCS x 2.  Consented, risks discussed including infection, bleeding, damage to surrounding structures, and need for additional procedures. Consent signed, agrees to proceed.   Katherine Ochoa 12/22/2015, 4:49 AM

## 2015-12-22 NOTE — Progress Notes (Signed)
Called to request MD start c/s for Dr. Elon SpannerLeger. While on phone with MD, patient RN called out stating patient is complete, ruptured and pushing.

## 2015-12-22 NOTE — Brief Op Note (Signed)
12/22/2015  5:10 AM  PATIENT:  Theotis Burrowina M Enke  35 y.o. female  PRE-OPERATIVE DIAGNOSIS:  repeat cesarean section   POST-OPERATIVE DIAGNOSIS:  repeat cesarean section   PROCEDURE:  Procedure(s): CESAREAN SECTION (N/A) Cystoscopy  SURGEON:  Surgeon(s) and Role:    * Ranae PilaElise Jennifer Khyson Sebesta, MD - Primary  PHYSICIAN ASSISTANT:   ASSISTANTS: Candelaria CelesteJacob Stinson   ANESTHESIA:   spinal and IV sedation  EBL:  Total I/O In: 5000 [I.V.:4500; IV Piggyback:500] Out: 2600 [Urine:100; Blood:2500]  BLOOD ADMINISTERED:none  DRAINS: none   LOCAL MEDICATIONS USED:  LIDOCAINE   SPECIMEN:  Source of Specimen:  placenta  DISPOSITION OF SPECIMEN:  PATHOLOGY  COUNTS:  YES  TOURNIQUET:  * No tourniquets in log *  DICTATION: .Note written in EPIC  PLAN OF CARE: Admit to inpatient   PATIENT DISPOSITION:  PACU - hemodynamically stable.   Delay start of Pharmacological VTE agent (>24hrs) due to surgical blood loss or risk of bleeding: not applicable

## 2015-12-22 NOTE — Transfer of Care (Signed)
Immediate Anesthesia Transfer of Care Note  Patient: Katherine Ochoa  Procedure(s) Performed: Procedure(s): CESAREAN SECTION (N/A)  Patient Location: PACU  Anesthesia Type:Spinal  Level of Consciousness: awake and oriented  Airway & Oxygen Therapy: Patient Spontanous Breathing  Post-op Assessment: Report given to RN and Post -op Vital signs reviewed and stable  Post vital signs: Reviewed and stable  Last Vitals:  Vitals:   12/22/15 0454 12/22/15 0455  BP:    Pulse: (!) 101 98  Resp: 17 (!) 21  Temp:      Last Pain:  Vitals:   12/22/15 0451  TempSrc: Oral  PainSc:          Complications: No apparent anesthesia complications

## 2015-12-23 ENCOUNTER — Encounter (HOSPITAL_COMMUNITY): Payer: Self-pay | Admitting: Obstetrics and Gynecology

## 2015-12-23 ENCOUNTER — Encounter (HOSPITAL_COMMUNITY)
Admission: RE | Admit: 2015-12-23 | Discharge: 2015-12-23 | Disposition: A | Discharge status: Home / Self Care | Payer: Medicaid Other | Source: Ambulatory Visit

## 2015-12-23 HISTORY — DX: Personal history of other infectious and parasitic diseases: Z86.19

## 2015-12-23 HISTORY — DX: Polycystic ovarian syndrome: E28.2

## 2015-12-23 LAB — CBC
HCT: 19.1 % — ABNORMAL LOW (ref 36.0–46.0)
Hemoglobin: 6.8 g/dL — CL (ref 12.0–15.0)
MCH: 31.5 pg (ref 26.0–34.0)
MCHC: 35.6 g/dL (ref 30.0–36.0)
MCV: 88.4 fL (ref 78.0–100.0)
PLATELETS: 174 10*3/uL (ref 150–400)
RBC: 2.16 MIL/uL — ABNORMAL LOW (ref 3.87–5.11)
RDW: 13.7 % (ref 11.5–15.5)
WBC: 19.6 10*3/uL — AB (ref 4.0–10.5)

## 2015-12-23 MED ORDER — FERROUS SULFATE 325 (65 FE) MG PO TABS
325.0000 mg | ORAL_TABLET | Freq: Three times a day (TID) | ORAL | Status: DC
  Administered 2015-12-23 – 2015-12-25 (×7): 325 mg via ORAL
  Filled 2015-12-23 (×7): qty 1

## 2015-12-23 MED ORDER — STERILE WATER FOR IRRIGATION IR SOLN
Status: DC | PRN
  Administered 2015-12-22: 1000 mL via INTRAVESICAL

## 2015-12-23 NOTE — Progress Notes (Signed)
Subjective: Postpartum Day 1: Cesarean Delivery Patient reports tolerating PO, + flatus and no problems voiding.    Objective: Vital signs in last 24 hours: Temp:  [97.9 F (36.6 C)-99.3 F (37.4 C)] 97.9 F (36.6 C) (12/13 0515) Pulse Rate:  [69-115] 83 (12/13 0515) Resp:  [14-20] 16 (12/13 0515) BP: (82-99)/(40-85) 96/45 (12/13 0515) SpO2:  [95 %-98 %] 97 % (12/13 0100)  Physical Exam:  General: alert and cooperative, ambulating with out complaints of dizziness, or SOB Lochia: appropriate Uterine Fundus: firm Incision: healing well, abd pressure dressing removed, honeycomb dressing replaced.no active bleeding noted DVT Evaluation: No evidence of DVT seen on physical exam. Negative Homan's sign. No cords or calf tenderness. No significant calf/ankle edema.   Recent Labs  12/22/15 0908 12/23/15 0240  HGB 8.3* 6.8*  HCT 23.7* 19.1*    Assessment/Plan: Status post Cesarean section. Postoperative course complicated by anemia, s/p feraheme infusion  Cbc in am FEso4 TID orally.  Katherine Ochoa G 12/23/2015, 8:58 AM

## 2015-12-23 NOTE — Lactation Note (Signed)
This note was copied from a baby's chart. Lactation Consultation Note Follow up visit at 39 hours of age.  Baby asleep in crib and mom reports just getting baby to sleep and not wanting to wake baby now.  Last feeding ~ 2 hours ago.   Baby has had 12 feedings with 5 voids and 5 stools with LATCH scores of "9-10" Mom has history of PCOS and hypothyroid that is not treated at this time.  Mom reports breastfeeding well with 2 older children for 18 month each.  Mom reports regular cycles, but increased testosterone and hair growth leading to PCOS diagnosis.  Mom reports hypothyroid prior to previous delivery.  LC discussed with mom how this can affect milk supply and encouraged mom to have follow up lab work with OB.   Mom has history of >2100 EBL with c/s, hemoglobin started at 13 and now down to 6.8.  Mom declined transfusion due to religious beliefs.  LC discussed how blood loss of this volume can affect milk supply.  LC discussed recommendation to post pump after feedings to stimulate milk production to establish a good supply.  Mom reports minimal pumping with older children and isn't very interested this time.  LC further encouraged mom to try and call for assistance to assess for flange fit and comfort.  Mom has private insurance and encouraged mom to call to requests DEBP.  Mom aware of o/p services and pump rental PRN. Mom reports knowing how to hand express, but that she has not been doing so before latching baby and unsure how much baby is getting.  LC encouraged mom to work on hand expression so she can see it and discussed intake and output to help assess for good feedings.  Mom denies pain with latch.   Mom to call for assist as needed.    Patient Name: Katherine Ochoa EAVWU'JToday's Date: 12/23/2015 Reason for consult: Follow-up assessment   Maternal Data    Feeding Feeding Type: Breast Fed Length of feed: 15 min  LATCH Score/Interventions                Intervention(s):  Breastfeeding basics reviewed     Lactation Tools Discussed/Used Initiated by:: set up by Virginia Crewsn, LC reviewed Date initiated:: 12/23/15   Consult Status Consult Status: Follow-up Date: 12/24/15 Follow-up type: In-patient    Katherine Ochoa, [name] 12/23/2015, 5:26 PM

## 2015-12-23 NOTE — Addendum Note (Signed)
Addendum  created 12/23/15 1457 by Heather RobertsJames Kiylah Loyer, MD   Anesthesia Staff edited

## 2015-12-24 ENCOUNTER — Encounter (HOSPITAL_COMMUNITY): Payer: Self-pay | Admitting: *Deleted

## 2015-12-24 ENCOUNTER — Inpatient Hospital Stay (HOSPITAL_COMMUNITY)
Admission: RE | Admit: 2015-12-24 | Payer: Medicaid Other | Source: Ambulatory Visit | Admitting: Obstetrics and Gynecology

## 2015-12-24 ENCOUNTER — Encounter (HOSPITAL_COMMUNITY): Admission: RE | Payer: Self-pay | Source: Ambulatory Visit

## 2015-12-24 LAB — CBC
HEMATOCRIT: 17 % — AB (ref 36.0–46.0)
HEMOGLOBIN: 5.8 g/dL — AB (ref 12.0–15.0)
MCH: 30.7 pg (ref 26.0–34.0)
MCHC: 34.1 g/dL (ref 30.0–36.0)
MCV: 89.9 fL (ref 78.0–100.0)
Platelets: 192 10*3/uL (ref 150–400)
RBC: 1.89 MIL/uL — AB (ref 3.87–5.11)
RDW: 13.9 % (ref 11.5–15.5)
WBC: 12.4 10*3/uL — ABNORMAL HIGH (ref 4.0–10.5)

## 2015-12-24 SURGERY — Surgical Case
Anesthesia: Regional

## 2015-12-24 MED ORDER — LACTATED RINGERS IV SOLN: INTRAVENOUS | Status: DC

## 2015-12-24 MED ORDER — LACTATED RINGERS IV BOLUS (SEPSIS): 250.0000 mL | Freq: Once | INTRAVENOUS | Status: DC

## 2015-12-24 NOTE — Progress Notes (Signed)
Subjective: Postpartum Day 2: Cesarean Delivery Patient reports tolerating PO, + flatus and no problems voiding.   Denies dizziness or SOB Objective: Vital signs in last 24 hours: Temp:  [97.6 F (36.4 C)] 97.6 F (36.4 C) (12/14 0528) Pulse Rate:  [88] 88 (12/14 0528) Resp:  [18] 18 (12/14 0528) BP: (107)/(61) 107/61 (12/14 0528)  Physical Exam:  General: alert and cooperative Lochia: appropriate Uterine Fundus: firm Incision: healing well DVT Evaluation: No evidence of DVT seen on physical exam. Negative Homan's sign. No cords or calf tenderness. No significant calf/ankle edema.   Recent Labs  12/23/15 0240 12/24/15 0612  HGB 6.8* 5.8*  HCT 19.1* 17.0*    Assessment/Plan: Status post Cesarean section. Postoperative course complicated by anemia  Cbc in am.  Katherine Ochoa 12/24/2015, 8:50 AM

## 2015-12-24 NOTE — Lactation Note (Signed)
This note was copied from a baby's chart. Lactation Consultation Note Mother states that infant is breastfeeding very well. She is sleeping now on mothers chest. Mother denies having only concerns about weight loss. Advised mother to hand express and she states that she feels awkward with hand expression. Advised mother to use the electric pump and have supplement ready for infant. Mother receptive to teaching.   Patient Name: [name] ZOXWR'UToday's Date: 12/24/2015     Maternal Data    Feeding Length of feed: 20 min  LATCH Score/Interventions                      Lactation Tools Discussed/Used     Consult Status      Michel BickersKendrick, Jamile Rekowski McCoy 12/24/2015, 12:20 PM

## 2015-12-25 LAB — CBC
HEMATOCRIT: 17.7 % — AB (ref 36.0–46.0)
Hemoglobin: 5.9 g/dL — CL (ref 12.0–15.0)
MCH: 30.4 pg (ref 26.0–34.0)
MCHC: 33.3 g/dL (ref 30.0–36.0)
MCV: 91.2 fL (ref 78.0–100.0)
Platelets: 227 10*3/uL (ref 150–400)
RBC: 1.94 MIL/uL — AB (ref 3.87–5.11)
RDW: 14.1 % (ref 11.5–15.5)
WBC: 8 10*3/uL (ref 4.0–10.5)

## 2015-12-25 MED ORDER — FERROUS SULFATE 325 (65 FE) MG PO TABS: 325.0000 mg | ORAL_TABLET | Freq: Three times a day (TID) | ORAL | 3 refills | Status: AC

## 2015-12-25 MED ORDER — IBUPROFEN 600 MG PO TABS: 600.0000 mg | ORAL_TABLET | Freq: Four times a day (QID) | ORAL | 0 refills | Status: AC

## 2015-12-25 NOTE — Discharge Summary (Signed)
Obstetric Discharge Summary Reason for Admission: onset of labor Prenatal Procedures: ultrasound Intrapartum Procedures: cesarean: low cervical, transverse Postpartum Procedures: iron infusion Complications-Operative and Postpartum: anemia Hemoglobin  Date Value Ref Range Status  12/25/2015 5.9 (LL) 12.0 - 15.0 g/dL Final    Comment:    REPEATED TO VERIFY CRITICAL RESULT CALLED TO, READ BACK BY AND VERIFIED WITH: COFF,L. @0655  ON 12/25/2015 BY BOVELL,A.    HCT  Date Value Ref Range Status  12/25/2015 17.7 (L) 36.0 - 46.0 % Final    Physical Exam:  General: alert and cooperative Lochia: appropriate Uterine Fundus: firm Incision: healing well DVT Evaluation: No evidence of DVT seen on physical exam. Negative Homan's sign. No cords or calf tenderness.  Discharge Diagnoses: Term Pregnancy-delivered  Discharge Information: Date: 12/25/2015 Activity: pelvic rest Diet: routine Medications: PNV, Ibuprofen and Iron Condition: stable Instructions: refer to practice specific booklet Discharge to: home RTo in 3 days for hgb check  Newborn Data: Live born female  Birth Weight: 6 lb 15.5 oz (3160 g) APGAR: ,   Home with mother.  CURTIS,CAROL G 12/25/2015, 7:49 AM

## 2015-12-25 NOTE — Progress Notes (Signed)
Notified Dr. Henderson Cloudomblin of new symptoms of anemia per pt: visual disturbances, fatigue, and headache. MD ordered LR bolus of 250 ml and to continue LR @ 125 ml/hr. MD also ordered pt up to bathroom only with assistance.

## 2015-12-25 NOTE — Progress Notes (Signed)
Patient called out with complaints of pain in the IV site. Upon inspection, IV was infiltrated. Immediately stopped pump and removed Iv. Ice pack applied and instructions on use provided. Dr. Henderson Cloudomblin called and stated to hold off on initiation of another line until after rounds. Hemoglobin lab value reported. No new orders placed.

## 2015-12-25 NOTE — Lactation Note (Signed)
This note was copied from a baby's chart. Lactation Consultation Note  Patient Name: Girl Hebert Sohoina Crilly ZOXWR'UToday's Date: 12/25/2015 Reason for consult: Follow-up assessment  Baby 2679 hours old. Mom reports that her milk is coming to volume, especially with left breast. Mom states that the baby is latching and nursing well. Mom reports that she understands that she needs to watch her supply and make sure baby satisfied at breast d/t EBL, hypothyroidism and PCOS. Mom aware of OP/BFSG and LC phone line assistance after D/C. Discussed engorgement prevention/treatment, and referred mom to Baby and Me booklet for number of diapers to expect by day of life and EBM storage guidelines.  Maternal Data    Feeding    LATCH Score/Interventions                      Lactation Tools Discussed/Used     Consult Status Consult Status: PRN    Sherlyn HayJennifer D Lymon Kidney 12/25/2015, 10:05 AM

## 2015-12-25 NOTE — Progress Notes (Signed)
CRITICAL VALUE STICKER  CRITICAL VALUE: Hemoglobin 5.9  RECEIVER (on-site recipient of call):Laura Koff  DATE & TIME NOTIFIED: 12/25/2015 0655  MESSENGER (representative from lab):  MD NOTIFIED:  Tomblin MD   TIME OF NOTIFICATION:12/25/2015 [id number]  RESPONSE: No new orders, will round shortly

## 2016-03-01 ENCOUNTER — Encounter: Payer: Self-pay | Admitting: Obstetrics and Gynecology

## 2016-07-08 ENCOUNTER — Emergency Department (HOSPITAL_COMMUNITY)
Admission: EM | Admit: 2016-07-08 | Discharge: 2016-07-08 | Disposition: A | Discharge status: Home / Self Care | Payer: Commercial Managed Care - PPO | Attending: Emergency Medicine | Admitting: Emergency Medicine

## 2016-07-08 ENCOUNTER — Encounter (HOSPITAL_COMMUNITY): Payer: Self-pay | Admitting: Emergency Medicine

## 2016-07-08 ENCOUNTER — Emergency Department (HOSPITAL_COMMUNITY): Payer: Commercial Managed Care - PPO

## 2016-07-08 DIAGNOSIS — R079 Chest pain, unspecified: Secondary | ICD-10-CM | POA: Insufficient documentation

## 2016-07-08 DIAGNOSIS — E039 Hypothyroidism, unspecified: Secondary | ICD-10-CM | POA: Insufficient documentation

## 2016-07-08 DIAGNOSIS — R112 Nausea with vomiting, unspecified: Secondary | ICD-10-CM | POA: Insufficient documentation

## 2016-07-08 LAB — BASIC METABOLIC PANEL
Anion gap: 13 (ref 5–15)
BUN: 21 mg/dL — ABNORMAL HIGH (ref 6–20)
CO2: 20 mmol/L — ABNORMAL LOW (ref 22–32)
Calcium: 9.2 mg/dL (ref 8.9–10.3)
Chloride: 105 mmol/L (ref 101–111)
Creatinine, Ser: 0.8 mg/dL (ref 0.44–1.00)
GFR calc Af Amer: >60 mL/min (ref >60)
GFR calc non Af Amer: >60 mL/min (ref >60)
Glucose, Bld: 108 mg/dL — ABNORMAL HIGH (ref 65–99)
Potassium: 3.5 mmol/L (ref 3.5–5.1)
Sodium: 138 mmol/L (ref 135–145)

## 2016-07-08 LAB — URINALYSIS, ROUTINE W REFLEX MICROSCOPIC
Bilirubin Urine: NEGATIVE
Glucose, UA: NEGATIVE mg/dL
Ketones, ur: 80 mg/dL — AB
Nitrite: NEGATIVE
Protein, ur: 30 mg/dL — AB
Specific Gravity, Urine: 1.036 — ABNORMAL HIGH (ref 1.005–1.030)
pH: 5 (ref 5.0–8.0)

## 2016-07-08 LAB — CBC
HCT: 44 % (ref 36.0–46.0)
Hemoglobin: 14.8 g/dL (ref 12.0–15.0)
MCH: 28.8 pg (ref 26.0–34.0)
MCHC: 33.6 g/dL (ref 30.0–36.0)
MCV: 85.8 fL (ref 78.0–100.0)
Platelets: 208 10*3/uL (ref 150–400)
RBC: 5.13 MIL/uL — ABNORMAL HIGH (ref 3.87–5.11)
RDW: 13 % (ref 11.5–15.5)
WBC: 7.5 10*3/uL (ref 4.0–10.5)

## 2016-07-08 LAB — HCG, QUANTITATIVE, PREGNANCY: hCG, Beta Chain, Quant, S: <1 m[IU]/mL (ref <5)

## 2016-07-08 LAB — I-STAT TROPONIN, ED: Troponin i, poc: 0 ng/mL (ref 0.00–0.08)

## 2016-07-08 LAB — LIPASE, BLOOD: Lipase: 28 U/L (ref 11–51)

## 2016-07-08 MED ORDER — ONDANSETRON HCL 4 MG/2ML IJ SOLN
4.0000 mg | Freq: Once | INTRAMUSCULAR | Status: AC
  Administered 2016-07-08: 4 mg via INTRAVENOUS
  Filled 2016-07-08: qty 2

## 2016-07-08 MED ORDER — ONDANSETRON HCL 4 MG PO TABS: 4.0000 mg | ORAL_TABLET | Freq: Four times a day (QID) | ORAL | 0 refills | Status: AC

## 2016-07-08 MED ORDER — DEXTROSE 5 % AND 0.9 % NACL IV BOLUS
1000.0000 mL | Freq: Once | INTRAVENOUS | Status: AC
  Administered 2016-07-08: 1000 mL via INTRAVENOUS

## 2016-07-08 NOTE — ED Notes (Signed)
Pt States she understands instructions and home stable with father.Steady gait.

## 2016-07-08 NOTE — ED Provider Notes (Signed)
[facility]-EMERGENCY DEPT Provider Note   CSN: [id number] Arrival date & time: 07/08/16  0518     History   Chief Complaint Chief Complaint  Patient presents with  . Emesis  . Chest Pain    HPI Katherine Ochoa is a 36 y.o. female.  HPI   36 year old female with nausea and vomiting. Onset about 4 days ago. Persistent since then. Intermittent crampy abdominal pain. No fevers or chills. No diarrhea. No urinary complaints. Denies any sick contacts.  Past Medical History:  Diagnosis Date  . Hx of varicella   . Hypothyroidism    stable at present-no meds  . PCOS (polycystic ovarian syndrome)     Patient Active Problem List   Diagnosis Date Noted  . S/P cesarean section 02/08/2013    Past Surgical History:  Procedure Laterality Date  . CESAREAN SECTION  08/08/2010   Procedure: CESAREAN SECTION;  Surgeon: Zelphia Cairo;  Location: [facility] ORS;  Service: Gynecology;  Laterality: N/A;  primary of baby boy at 2105  . CESAREAN SECTION N/A 02/08/2013   Procedure: REPEAT CESAREAN SECTION;  Surgeon: Jeani Hawking, MD;  Location: [facility];  Service: Obstetrics;  Laterality: N/A;  Repeat edc 02/15/13  . CESAREAN SECTION N/A 12/22/2015   Procedure: CESAREAN SECTION;  Surgeon: Ranae Pila, MD;  Location: [facility] BIRTHING SUITES;  Service: Obstetrics;  Laterality: N/A;  . CYSTOSCOPY N/A 12/22/2015   Procedure: CYSTOSCOPY;  Surgeon: Ranae Pila, MD;  Location: [facility] BIRTHING SUITES;  Service: Obstetrics;  Laterality: N/A;  . WISDOM TOOTH EXTRACTION      OB History    Gravida Para Term Preterm AB Living   4 3 1   1 3    SAB TAB Ectopic Multiple Live Births   1     0 3       Home Medications    Prior to Admission medications   Medication Sig Start Date End Date Taking? Authorizing Provider  prenatal vitamin w/FE, FA (PRENATAL 1 + 1) 27-1 MG TABS Take 1 tablet by mouth daily.     Yes [provider]  ferrous sulfate 325 (65 FE) MG tablet Take 1 tablet (325 mg total) by  mouth 3 (three) times daily with meals. Patient not taking: Reported on 07/08/2016 12/25/15   Julio Sicks, NP  ibuprofen (ADVIL,MOTRIN) 600 MG tablet Take 1 tablet (600 mg total) by mouth every 6 (six) hours. Patient not taking: Reported on 07/08/2016 12/25/15   Julio Sicks, NP    Family History Family History  Problem Relation Age of Onset  . Diabetes Father   . Stroke Father   . Heart disease Father   . Mental illness Father   . Thyroid disease Mother   . Thyroid disease Maternal Aunt     Social History Social History  Substance Use Topics  . Smoking status: Never Smoker  . Smokeless tobacco: Never Used  . Alcohol use No     Allergies   Patient has no known allergies.   Review of Systems Review of Systems  All systems reviewed and negative, other than as noted in HPI.  Physical Exam Updated Vital Signs BP 114/79 (BP Location: Left Arm)   Pulse 91   Temp 98.5 F (36.9 C) (Oral)   Resp 18   Ht 5\' 6"  (1.676 m)   Wt 82.1 kg (181 lb)   LMP 06/28/2016 (Approximate)   SpO2 99%   BMI 29.21 kg/m   Physical Exam  Constitutional: She appears well-developed and well-nourished. No  distress.  HENT:  Head: Normocephalic and atraumatic.  Eyes: Conjunctivae are normal. Right eye exhibits no discharge. Left eye exhibits no discharge.  Neck: Neck supple.  Cardiovascular: Normal rate, regular rhythm and normal heart sounds.  Exam reveals no gallop and no friction rub.   No murmur heard. Pulmonary/Chest: Effort normal and breath sounds normal. No respiratory distress.  Abdominal: Soft. She exhibits no distension. There is no tenderness.  Musculoskeletal: She exhibits no edema or tenderness.  Neurological: She is alert.  Skin: Skin is warm and dry.  Psychiatric: She has a normal mood and affect. Her behavior is normal. Thought content normal.  Nursing note and vitals reviewed.    ED Treatments / Results  Labs (all labs ordered are listed, but only abnormal results  are displayed) Labs Reviewed  BASIC METABOLIC PANEL - Abnormal; Notable for the following:       Result Value   CO2 20 (*)    Glucose, Bld 108 (*)    BUN 21 (*)    All other components within normal limits  CBC - Abnormal; Notable for the following:    RBC 5.13 (*)    All other components within normal limits  URINALYSIS, ROUTINE W REFLEX MICROSCOPIC - Abnormal; Notable for the following:    APPearance HAZY (*)    Specific Gravity, Urine 1.036 (*)    Hgb urine dipstick SMALL (*)    Ketones, ur 80 (*)    Protein, ur 30 (*)    Leukocytes, UA TRACE (*)    Bacteria, UA RARE (*)    Squamous Epithelial / LPF 0-5 (*)    All other components within normal limits  LIPASE, BLOOD  HCG, QUANTITATIVE, PREGNANCY  I-STAT TROPOININ, ED    EKG  EKG Interpretation  Date/Time:  Friday July 08 2016 05:30:14 EDT Ventricular Rate:  101 PR Interval:  132 QRS Duration: 78 QT Interval:  340 QTC Calculation: 440 R Axis:   57 Text Interpretation:  Sinus tachycardia Cannot rule out Anterior infarct , age undetermined Abnormal ECG Confirmed by Juleen China  MD, Maribel Luis [phone]) on 07/08/2016 7:08:32 AM       Radiology Dg Chest 2 View  Result Date: 07/08/2016 CLINICAL DATA:  Chest pain. EXAM: CHEST  2 VIEW COMPARISON:  No prior. FINDINGS: The heart size and mediastinal contours are within normal limits. Both lungs are clear. Mild scoliosis concave right. IMPRESSION: No active cardiopulmonary disease. Electronically Signed   By: Maisie Fus  Register   On: 07/08/2016 06:09    Procedures Procedures (including critical care time)  Medications Ordered in ED Medications  dextrose 5 % and 0.9% NaCl 5-0.9 % bolus 1,000 mL (0 mLs Intravenous Stopped 07/08/16 0948)  ondansetron (ZOFRAN) injection 4 mg (4 mg Intravenous Given 07/08/16 0819)     Initial Impression / Assessment and Plan / ED Course  I have reviewed the triage vital signs and the nursing notes.  Pertinent labs & imaging results that were available  during my care of the patient were reviewed by me and considered in my medical decision making (see chart for details).  Clinical Course as of Jul 08 1000  Fri Jul 08, 2016  0959 Feeling much better. Offered crackers/water/gingerale but declining. She would rather let her stomach continue to rest. She feels comfortable going home.   [SK]    Clinical Course User Index [SK] Raeford Razor, MD    35yF with n/v. Abdomen benign. Labs fairly unremarkable aside from ketones on UA. D5NS given. Now feeling better and  would like to go home.   It has been determined that no acute conditions requiring further emergency intervention are present at this time. The patient has been advised of the diagnosis and plan. I reviewed any labs and imaging including any potential incidental findings. We have discussed signs and symptoms that warrant return to the ED and they are listed in the discharge instructions.    Final Clinical Impressions(s) / ED Diagnoses   Final diagnoses:  Non-intractable vomiting with nausea, unspecified vomiting type    New Prescriptions New Prescriptions   No medications on file     Raeford RazorKohut, Kam Kushnir, MD 07/17/16 [phone]

## 2016-07-08 NOTE — ED Triage Notes (Signed)
Pt has been experiencing nausea and vomiting for four days, now complains of centralized "chest heaviness".  Pt reports that she feels "run down" and is not sure it is because of the fact she is breast feeding.

## 2018-03-11 IMAGING — CR DG CHEST 2V
2 series · 2 of 2 positions shown · non-contrast
Comparison: No prior.

CLINICAL DATA: Chest pain.

EXAM:
CHEST  2 VIEW

[chest pa]
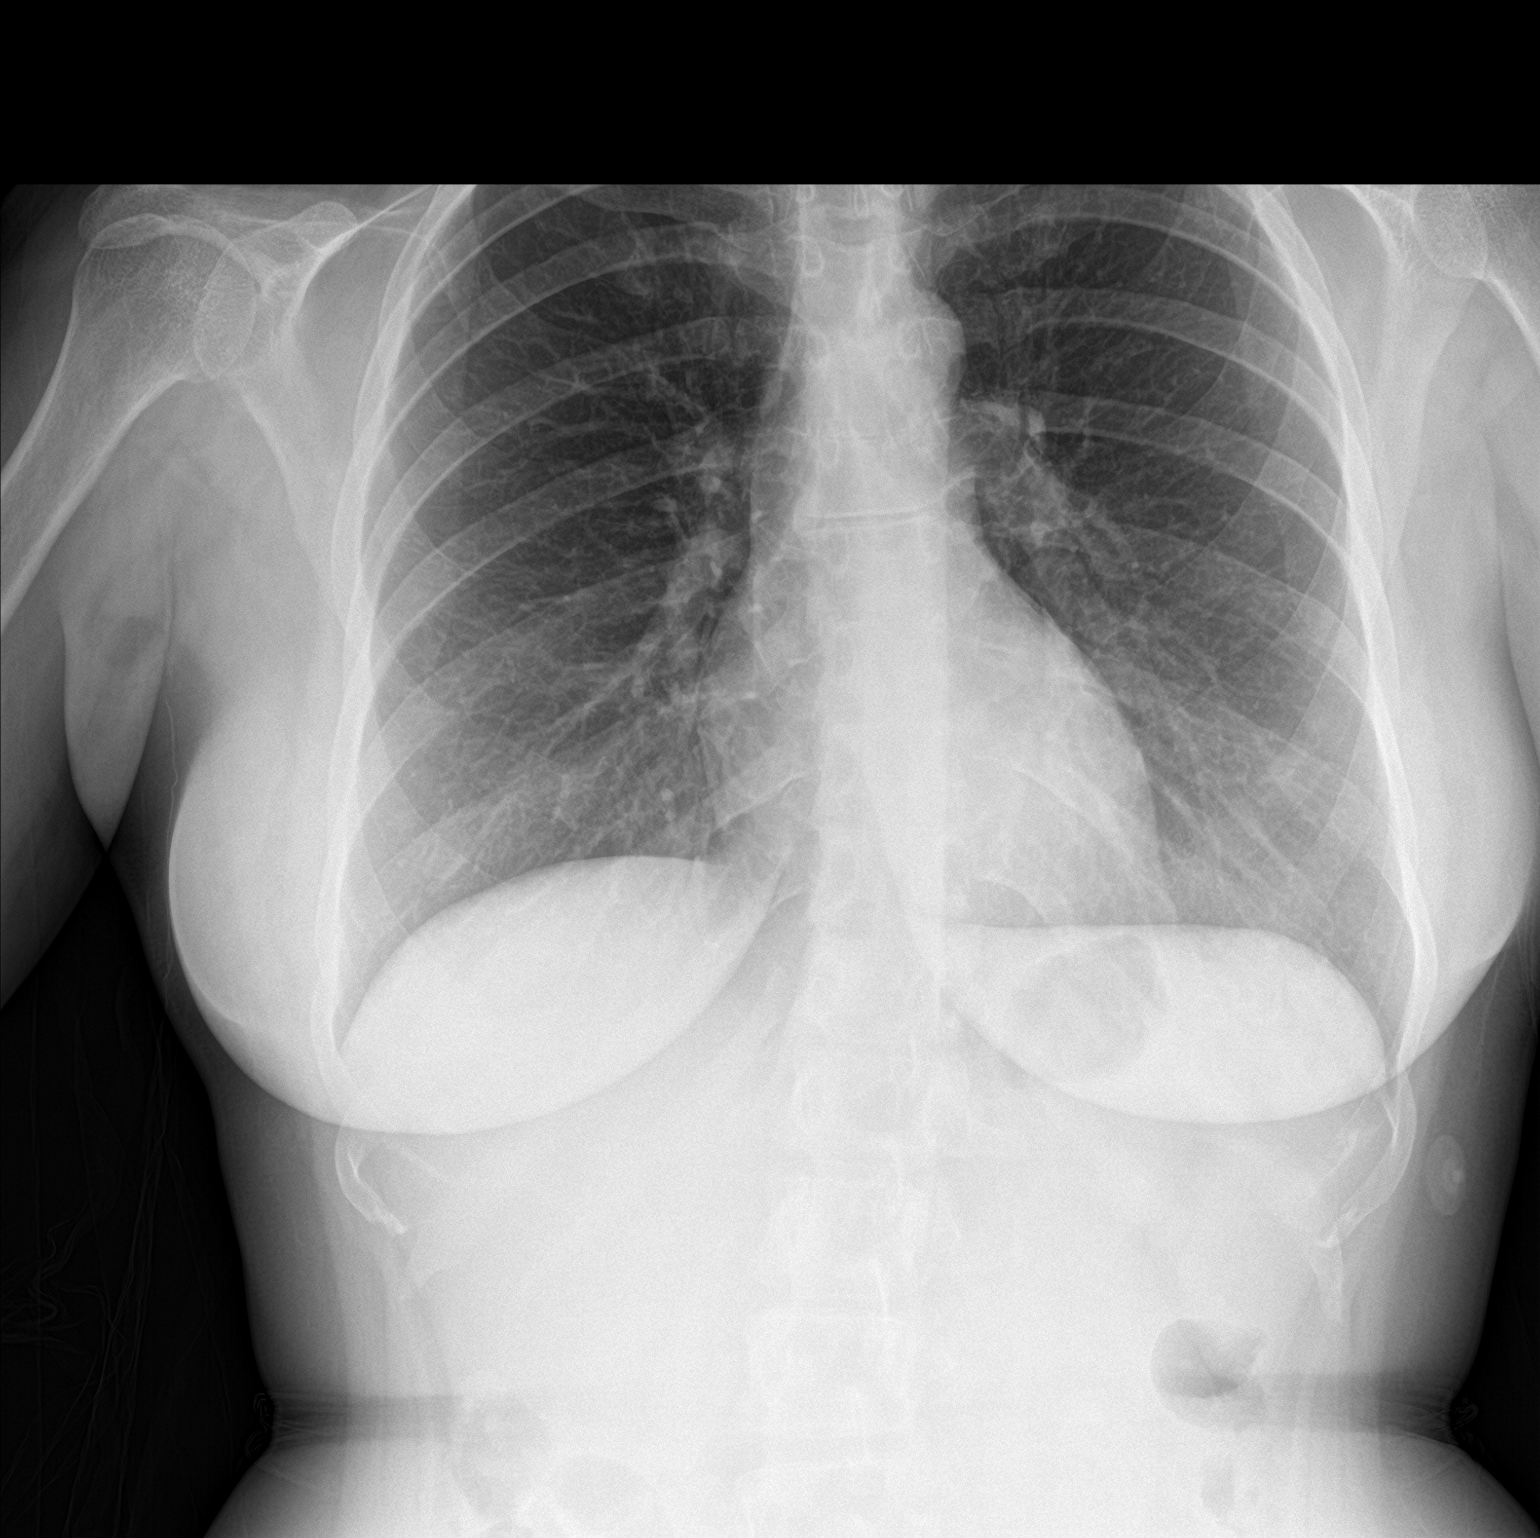

[chest lat]
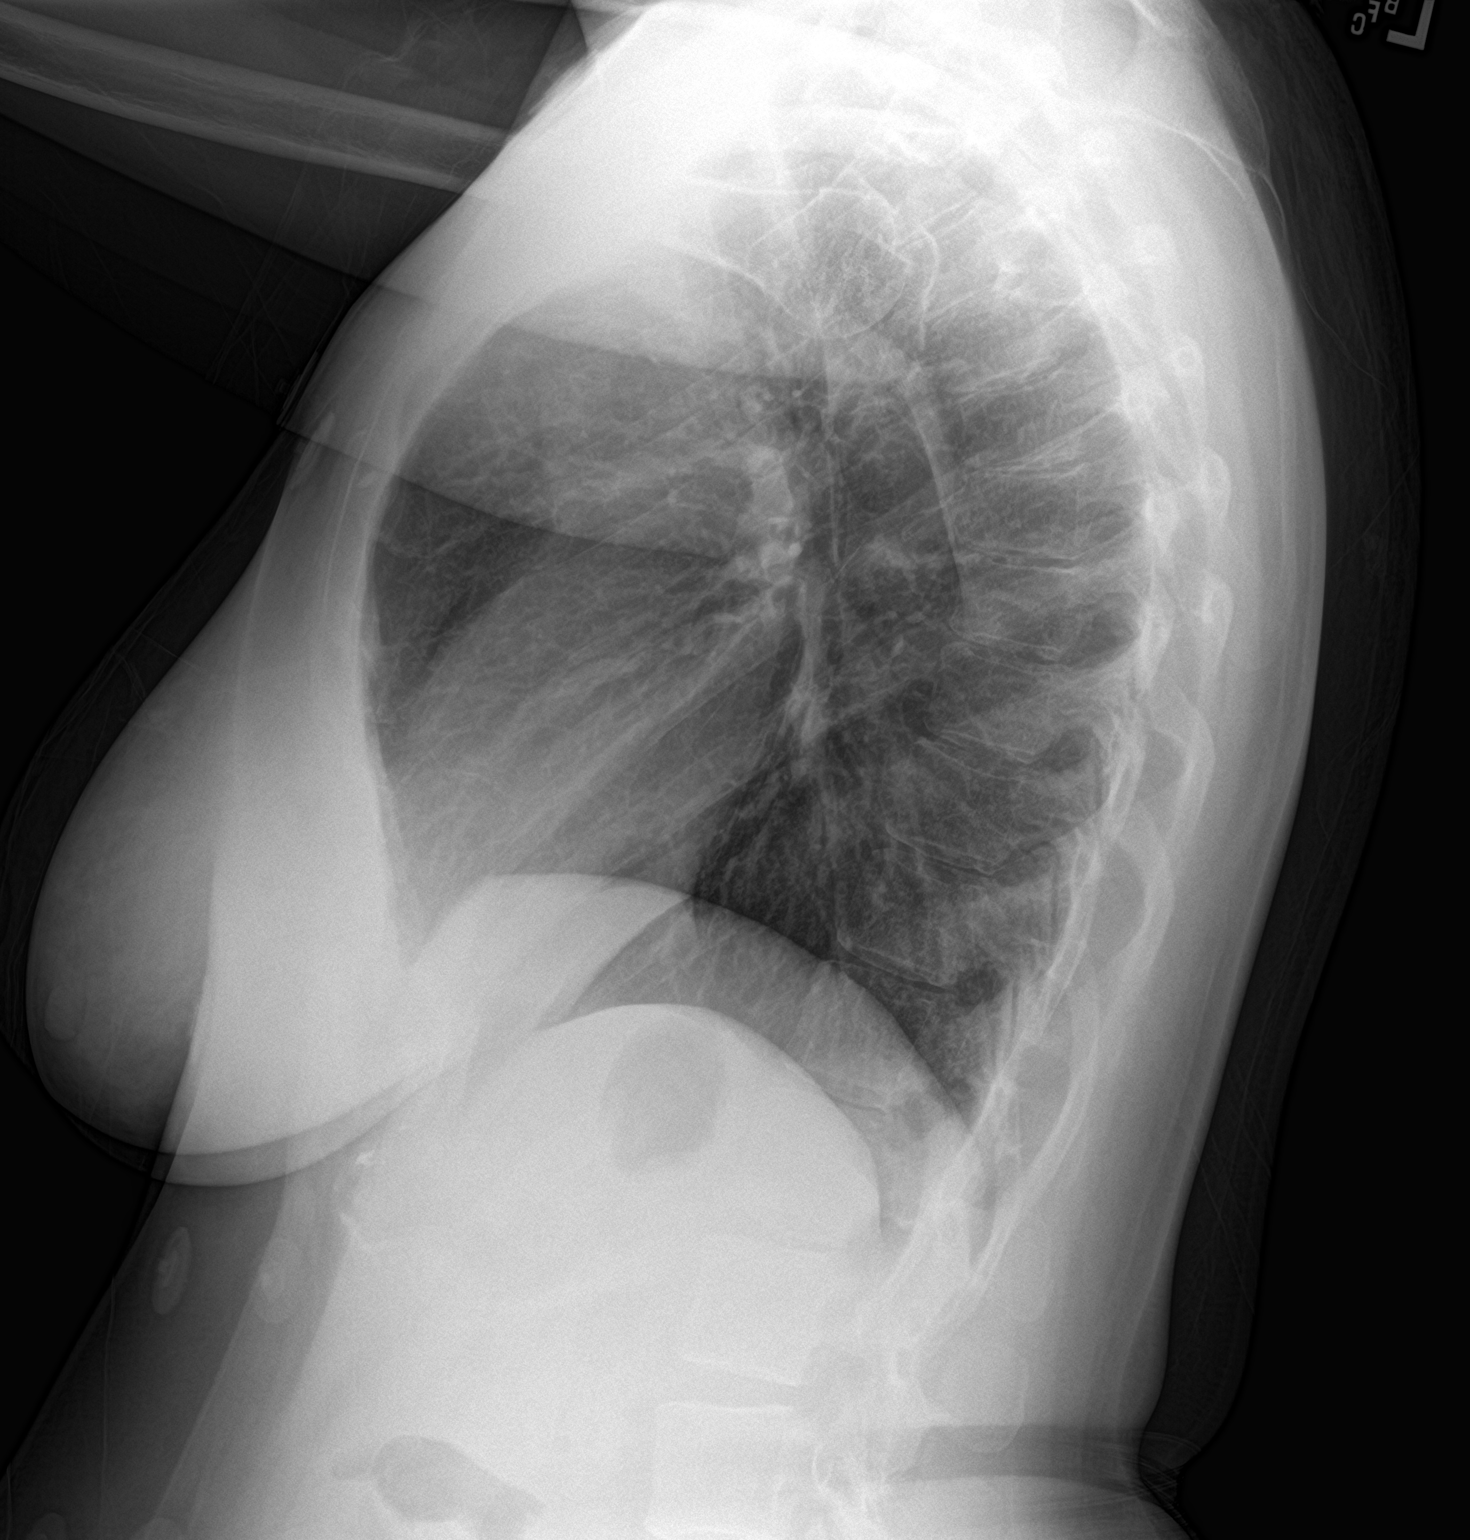

[2 of 2 positions shown; findings below may reference images not displayed]

FINDINGS: The heart size and mediastinal contours are within normal limits.
Both lungs are clear. Mild scoliosis concave right.
IMPRESSION: No active cardiopulmonary disease.

## 2022-02-26 ENCOUNTER — Emergency Department (HOSPITAL_COMMUNITY)
Admission: EM | Admit: 2022-02-26 | Discharge: 2022-02-26 | Disposition: A | Discharge status: Home / Self Care | Payer: 59 | Attending: Emergency Medicine | Admitting: Emergency Medicine

## 2022-02-26 DIAGNOSIS — Z1152 Encounter for screening for COVID-19: Secondary | ICD-10-CM | POA: Insufficient documentation

## 2022-02-26 DIAGNOSIS — R110 Nausea: Secondary | ICD-10-CM | POA: Insufficient documentation

## 2022-02-26 DIAGNOSIS — R531 Weakness: Secondary | ICD-10-CM | POA: Diagnosis not present

## 2022-02-26 DIAGNOSIS — N938 Other specified abnormal uterine and vaginal bleeding: Secondary | ICD-10-CM | POA: Insufficient documentation

## 2022-02-26 DIAGNOSIS — R197 Diarrhea, unspecified: Secondary | ICD-10-CM | POA: Insufficient documentation

## 2022-02-26 DIAGNOSIS — J029 Acute pharyngitis, unspecified: Secondary | ICD-10-CM | POA: Insufficient documentation

## 2022-02-26 DIAGNOSIS — R509 Fever, unspecified: Secondary | ICD-10-CM | POA: Insufficient documentation

## 2022-02-26 DIAGNOSIS — N939 Abnormal uterine and vaginal bleeding, unspecified: Secondary | ICD-10-CM

## 2022-02-26 LAB — CBC
HCT: 41.2 % (ref 36.0–46.0)
Hemoglobin: 14.1 g/dL (ref 12.0–15.0)
MCH: 29 pg (ref 26.0–34.0)
MCHC: 34.2 g/dL (ref 30.0–36.0)
MCV: 84.6 fL (ref 80.0–100.0)
Platelets: 123 10*3/uL — ABNORMAL LOW (ref 150–400)
RBC: 4.87 MIL/uL (ref 3.87–5.11)
RDW: 13.1 % (ref 11.5–15.5)
WBC: 2 10*3/uL — ABNORMAL LOW (ref 4.0–10.5)
nRBC: 0 % (ref 0.0–0.2)

## 2022-02-26 LAB — RESP PANEL BY RT-PCR (RSV, FLU A&B, COVID)  RVPGX2
Influenza A by PCR: NEGATIVE
Influenza B by PCR: POSITIVE — AB
Resp Syncytial Virus by PCR: NEGATIVE
SARS Coronavirus 2 by RT PCR: NEGATIVE

## 2022-02-26 NOTE — ED Provider Notes (Signed)
[facility] Provider Note   CSN: [id number] Arrival date & time: 02/26/22  1221     History  Chief Complaint  Patient presents with   Vaginal Bleeding    GRAYCEN VELASTEGUI is a 42 y.o. female with Hx of menorrhagia, GERD, prior C-sections presenting to the ED with chief complaint of vaginal bleeding, weakness, fever, diarrhea, and nausea.  All symptoms started on Monday, except for the vaginal bleeding which started on Wednesday.  States she is 10 days early from her menstrual cycle.  Usually has heavy first 1-2 days, however states it should have lessened by today and has not.  States she was evaluated by OB/GYN and recommended for an ablation due to excessive menstrual bleeding, however it deferred at that time.  Patient states she would like to make sure she is not severely anemic and for peace of mind.  Recent flu-like symptoms with family members prior to her onset.  Denies possibility of pregnancy, states she has not had sexual intercourse in the last 2 years.  Denies abdominal pain, chest pain, shortness of breath, neck stiffness, N/V/D, urinary symptoms, vaginal discharge.  [id number] per records.  The history is provided by the patient and medical records.  Vaginal Bleeding     Home Medications Prior to Admission medications   Medication Sig Start Date End Date Taking? Authorizing Provider  ferrous sulfate 325 (65 FE) MG tablet Take 1 tablet (325 mg total) by mouth 3 (three) times daily with meals. Patient not taking: Reported on 07/08/2016 12/25/15   Juanda Chance, NP  ibuprofen (ADVIL,MOTRIN) 600 MG tablet Take 1 tablet (600 mg total) by mouth every 6 (six) hours. Patient not taking: Reported on 07/08/2016 12/25/15   Juanda Chance, NP  ondansetron (ZOFRAN) 4 MG tablet Take 1 tablet (4 mg total) by mouth every 6 (six) hours. 07/08/16   Virgel Manifold, MD  prenatal vitamin w/FE, FA (PRENATAL 1 + 1) 27-1 MG TABS Take 1 tablet by mouth daily.       [provider]      Allergies    Patient has no known allergies.    Review of Systems   Review of Systems  Genitourinary:  Positive for vaginal bleeding.    Physical Exam Updated Vital Signs BP (!) 100/29   Pulse 87   Temp 98.6 F (37 C) (Oral)   Resp 18   SpO2 98%  Physical Exam Vitals and nursing note reviewed.  Constitutional:      General: She is not in acute distress.    Appearance: She is well-developed. She is not ill-appearing, toxic-appearing or diaphoretic.     Comments: Sitting comfortably  HENT:     Head: Normocephalic and atraumatic.     Nose: Congestion and rhinorrhea present.     Mouth/Throat:     Mouth: Mucous membranes are moist.     Pharynx: Oropharynx is clear.     Comments: Airway patent.  Without tonsillar exudate, edema, abscess, or erythema.  Mild erythema of the posterior oropharynx.  Handling secretions without difficulty.  Uvula midline without swelling. Eyes:     Conjunctiva/sclera: Conjunctivae normal.  Neck:     Comments: No meningismus or torticollis Cardiovascular:     Rate and Rhythm: Normal rate and regular rhythm.     Heart sounds: No murmur heard. Pulmonary:     Effort: Pulmonary effort is normal. No respiratory distress.     Breath sounds: Normal breath sounds. No wheezing.  Comments: CTAB, able to communicate well without difficulty, without increased respiratory effort.  Adequate air movement, equal chest rise. Chest:     Chest wall: No tenderness.  Abdominal:     General: There is no distension.     Palpations: Abdomen is soft.     Tenderness: There is no abdominal tenderness. There is no guarding.  Musculoskeletal:        General: No swelling.     Cervical back: Neck supple. No rigidity.  Lymphadenopathy:     Cervical: No cervical adenopathy.  Skin:    General: Skin is warm and dry.     Capillary Refill: Capillary refill takes less than 2 seconds.     Coloration: Skin is not jaundiced or pale.      Findings: No erythema.  Neurological:     Mental Status: She is alert.  Psychiatric:        Mood and Affect: Mood normal.     ED Results / Procedures / Treatments   Labs (all labs ordered are listed, but only abnormal results are displayed) Labs Reviewed  RESP PANEL BY RT-PCR (RSV, FLU A&B, COVID)  RVPGX2 - Abnormal; Notable for the following components:      Result Value   Influenza B by PCR POSITIVE (*)    All other components within normal limits  CBC - Abnormal; Notable for the following components:   WBC 2.0 (*)    Platelets 123 (*)    All other components within normal limits    EKG None  Radiology No results found.  Procedures Procedures    Medications Ordered in ED Medications - No data to display  ED Course/ Medical Decision Making/ A&P Clinical Course as of 02/26/22 2145  Sat Feb 26, 2022  2140 Influenza B By PCR(!): POSITIVE [AC]    Clinical Course User Index [AC] Prince Rome, PA-C                             Medical Decision Making Amount and/or Complexity of Data Reviewed Labs: ordered.   Patient is a 42 year old female presenting to the ED with flulike symptoms and 3 days of vaginal bleeding 10 days early from normal menses.  Known Hx of menorrhagia.  Per patient, was recommended by OB/GYN for an ablation for treatment of heavy menses, however deferred at that time.  Well-appearing on exam.  HDS, nontoxic, nonseptic appearing in NAD.  Afebrile.  Reported poor sleep, nutrition intake over last few days.  May be contributing to fatigue.  Not diaphoretic.  Without dizziness.  No chest pain or shortness of breath.  EKG reviewed, indicates normal sinus rhythm without significant ST changes, arrhythmia, or evidence of myocardial infarction.  Reviewed labs, without evidence of significant anemia.  No pallor/pale or cyanotic presentation.  Doubt acute blood loss anemia.  No urinary symptoms or vaginal discharge\suprapubic/abdominal tenderness.  Low  suspicion for UTI, pyelonephritis, PID.  No recent intercourse in the last 2 years, low suspicion for pregnancy, including ectopic.  Clinical exam overall unremarkable, suggestive of likely URI.  Decreased WBC 2.0, likely suggestive of acute viral infection though overall nonspecific.  Recent sick contacts.  Low suspicion for RPA, PTA, strep pharyngitis, meningitis, otitis media, otitis externa, mastoiditis, pneumonia at this time.  Tested positive for influenza B.    Considering patient's recollection of OB/GYN's recommendation for an ablation to help reduce vaginal bleeding, this demonstrates a known history of heavy vaginal bleeding.  Recommend follow-up with OB/GYN next week as planned to rediscuss treatment options, and continued conservative symptom management of viral pharyngitis.  Strict return precautions discussed at length with pt and significant other now at bedside via verbal consent of patient.  Patient reports satisfaction with today's encounter.  Patient in NAD and good condition at time of discharge.  After consideration the patient's encounter today, I do not feel today's workup suggests an emergent condition requiring admission or immediate intervention beyond what has been performed at this time.  Safe for discharge; instructed to return immediately for worsening symptoms, change in symptoms or any other concerns.  I have reviewed the patients home medicines and have made adjustments as needed.  Discussed course of treatment with the patient, whom demonstrated understanding.  Patient in agreement and has no further questions.    This chart was dictated using voice recognition software.  Despite best efforts to proofread,  errors can occur which can change the documentation meaning.         Final Clinical Impression(s) / ED Diagnoses Final diagnoses:  Viral pharyngitis  Vaginal bleeding    Rx / DC Orders ED Discharge Orders     None         Candace Cruise [id number]    Godfrey Pick, MD 03/01/22 [phone]

## 2022-02-26 NOTE — Discharge Instructions (Addendum)
You were evaluated in the emergency department today for early vaginal bleeding and flulike symptoms.  Your workup today did not indicate evidence of significant anemia.  Recommend to follow-up with OB/GYN next week as planned for reevaluation and continued medical management.  You are also tested for COVID, flu, and RSV.  This has not resulted, however you may follow-up on your test results using MyChart.  Instructions on how to do so can provided your discharge paperwork.  Continue to manage symptoms conservatively with over-the-counter treatments such as Mucinex, Tylenol, ibuprofen, and Claritin/Zyrtec.  Get plenty of rest, stay hydrated, and utilize tea with honey for cough or sore throat.  May follow-up with your PCP within 1 week for reevaluation as needed.  Return to the ED for new or worsening symptoms as discussed.

## 2022-02-26 NOTE — ED Triage Notes (Signed)
Patient here for evaluation of generalized weakness, fever, dairrhea and nausea since Monday. Patients states then Wednesday she started having vaginal bleeding and states she was ten days early than her regular menstrual period. Patient is alert, oriented, ambulatory, and in no apparent distress at this time.

## 2023-03-21 DIAGNOSIS — Z1330 Encounter for screening examination for mental health and behavioral disorders, unspecified: Secondary | ICD-10-CM | POA: Diagnosis not present

## 2023-03-21 DIAGNOSIS — H938X3 Other specified disorders of ear, bilateral: Secondary | ICD-10-CM | POA: Diagnosis not present

## 2023-03-21 DIAGNOSIS — J302 Other seasonal allergic rhinitis: Secondary | ICD-10-CM | POA: Diagnosis not present

## 2023-03-21 DIAGNOSIS — H6123 Impacted cerumen, bilateral: Secondary | ICD-10-CM | POA: Diagnosis not present

## 2023-04-14 DIAGNOSIS — H938X9 Other specified disorders of ear, unspecified ear: Secondary | ICD-10-CM | POA: Diagnosis not present

## 2023-04-14 DIAGNOSIS — H43399 Other vitreous opacities, unspecified eye: Secondary | ICD-10-CM | POA: Diagnosis not present

## 2023-04-14 DIAGNOSIS — J302 Other seasonal allergic rhinitis: Secondary | ICD-10-CM | POA: Diagnosis not present

## 2023-05-12 DIAGNOSIS — H6993 Unspecified Eustachian tube disorder, bilateral: Secondary | ICD-10-CM | POA: Diagnosis not present

## 2023-05-12 DIAGNOSIS — H938X3 Other specified disorders of ear, bilateral: Secondary | ICD-10-CM | POA: Diagnosis not present

## 2023-05-12 DIAGNOSIS — H6506 Acute serous otitis media, recurrent, bilateral: Secondary | ICD-10-CM | POA: Diagnosis not present

## 2023-06-23 DIAGNOSIS — L299 Pruritus, unspecified: Secondary | ICD-10-CM | POA: Diagnosis not present

## 2023-06-23 DIAGNOSIS — H938X3 Other specified disorders of ear, bilateral: Secondary | ICD-10-CM | POA: Diagnosis not present

## 2023-06-23 DIAGNOSIS — H93293 Other abnormal auditory perceptions, bilateral: Secondary | ICD-10-CM | POA: Diagnosis not present
